# Patient Record
Sex: Female | Born: 1980
Health system: Southern US, Community
[De-identification: ages and names within clinical notes are randomized; demographics above are authoritative.]

## PROBLEM LIST (undated history)

## (undated) DIAGNOSIS — I10 Essential (primary) hypertension: Secondary | ICD-10-CM

## (undated) DIAGNOSIS — M1990 Unspecified osteoarthritis, unspecified site: Secondary | ICD-10-CM

## (undated) DIAGNOSIS — M7660 Achilles tendinitis, unspecified leg: Secondary | ICD-10-CM

## (undated) DIAGNOSIS — E785 Hyperlipidemia, unspecified: Secondary | ICD-10-CM

## (undated) HISTORY — DX: Hyperlipidemia, unspecified: E78.5

## (undated) HISTORY — DX: Achilles tendinitis, unspecified leg: M76.60

## (undated) HISTORY — PX: CHOLECYSTECTOMY: SHX55

## (undated) HISTORY — DX: Unspecified osteoarthritis, unspecified site: M19.90

---

## 2010-07-22 ENCOUNTER — Emergency Department: Payer: Self-pay | Admitting: Emergency Medicine

## 2010-08-10 ENCOUNTER — Ambulatory Visit: Payer: Self-pay | Admitting: Surgery

## 2011-09-26 ENCOUNTER — Inpatient Hospital Stay: Payer: Self-pay | Admitting: Student

## 2011-09-26 LAB — CBC WITH DIFFERENTIAL/PLATELET
Eosinophil #: 0 10*3/uL (ref 0.0–0.7)
Eosinophil %: 0 %
MCH: 27.2 pg (ref 26.0–34.0)
MCHC: 33.6 g/dL (ref 32.0–36.0)
MCV: 81 fL (ref 80–100)
Monocyte #: 1.2 10*3/uL — ABNORMAL HIGH (ref 0.0–0.7)
Neutrophil %: 88.9 %
Platelet: 325 10*3/uL (ref 150–440)
RBC: 4.66 10*6/uL (ref 3.80–5.20)
RDW: 16.1 % — ABNORMAL HIGH (ref 11.5–14.5)

## 2011-09-26 LAB — URINALYSIS, COMPLETE
Bacteria: NONE SEEN
Glucose,UR: NEGATIVE mg/dL (ref 0–75)
Hyaline Cast: 3
Leukocyte Esterase: NEGATIVE
Nitrite: NEGATIVE
Protein: 30
RBC,UR: 1 /HPF (ref 0–5)
Specific Gravity: 1.02 (ref 1.003–1.030)
Squamous Epithelial: 3

## 2011-09-26 LAB — COMPREHENSIVE METABOLIC PANEL
Albumin: 3.4 g/dL (ref 3.4–5.0)
Anion Gap: 12 (ref 7–16)
Bilirubin,Total: 0.8 mg/dL (ref 0.2–1.0)
Calcium, Total: 8.9 mg/dL (ref 8.5–10.1)
Chloride: 96 mmol/L — ABNORMAL LOW (ref 98–107)
Co2: 26 mmol/L (ref 21–32)
Creatinine: 1.43 mg/dL — ABNORMAL HIGH (ref 0.60–1.30)
EGFR (African American): 55 — ABNORMAL LOW
Glucose: 237 mg/dL — ABNORMAL HIGH (ref 65–99)
Osmolality: 276 (ref 275–301)
Potassium: 2.7 mmol/L — ABNORMAL LOW (ref 3.5–5.1)
SGOT(AST): 18 U/L (ref 15–37)
Sodium: 134 mmol/L — ABNORMAL LOW (ref 136–145)

## 2011-09-26 LAB — RAPID INFLUENZA A&B ANTIGENS

## 2011-09-27 LAB — BASIC METABOLIC PANEL
Anion Gap: 11 (ref 7–16)
BUN: 12 mg/dL (ref 7–18)
Chloride: 102 mmol/L (ref 98–107)
Co2: 28 mmol/L (ref 21–32)
Creatinine: 1.13 mg/dL (ref 0.60–1.30)
EGFR (African American): >60
EGFR (Non-African Amer.): 60 — ABNORMAL LOW
Glucose: 112 mg/dL — ABNORMAL HIGH (ref 65–99)

## 2011-09-27 LAB — CBC WITH DIFFERENTIAL/PLATELET
Basophil %: 0 %
Eosinophil #: 0 10*3/uL (ref 0.0–0.7)
HCT: 35.1 % (ref 35.0–47.0)
Lymphocyte #: 2.2 10*3/uL (ref 1.0–3.6)
Lymphocyte %: 8 %
MCH: 26.6 pg (ref 26.0–34.0)
MCHC: 32.5 g/dL (ref 32.0–36.0)
Monocyte #: 2.1 10*3/uL — ABNORMAL HIGH (ref 0.0–0.7)
Monocyte %: 7.8 %
Monocytes: 6 %
Neutrophil #: 23 10*3/uL — ABNORMAL HIGH (ref 1.4–6.5)
Neutrophil %: 84.1 %
Platelet: 280 10*3/uL (ref 150–440)
RBC: 4.3 10*6/uL (ref 3.80–5.20)
RDW: 15.7 % — ABNORMAL HIGH (ref 11.5–14.5)
Segmented Neutrophils: 82 %
WBC: 27.4 10*3/uL — ABNORMAL HIGH (ref 3.6–11.0)

## 2011-09-27 LAB — MAGNESIUM: Magnesium: 1.5 mg/dL — ABNORMAL LOW

## 2011-09-27 LAB — HEMOGLOBIN A1C: Hemoglobin A1C: 6.9 % — ABNORMAL HIGH (ref 4.2–6.3)

## 2011-09-28 LAB — CBC WITH DIFFERENTIAL/PLATELET
Eosinophil #: 0.1 10*3/uL (ref 0.0–0.7)
Eosinophil %: 0.7 %
MCH: 26.1 pg (ref 26.0–34.0)
MCHC: 32 g/dL (ref 32.0–36.0)
Monocyte #: 1.4 10*3/uL — ABNORMAL HIGH (ref 0.0–0.7)
Neutrophil %: 73 %
Platelet: 298 10*3/uL (ref 150–440)
RBC: 4.06 10*6/uL (ref 3.80–5.20)
RDW: 15.9 % — ABNORMAL HIGH (ref 11.5–14.5)

## 2011-10-01 LAB — EXPECTORATED SPUTUM ASSESSMENT W GRAM STAIN, RFLX TO RESP C

## 2011-10-02 LAB — CULTURE, BLOOD (SINGLE)

## 2011-10-19 ENCOUNTER — Ambulatory Visit: Payer: Self-pay | Admitting: Family Medicine

## 2014-12-19 NOTE — Discharge Summary (Signed)
PATIENT NAME:  Leslie Mendoza, [name] MR#:  [id number] DATE OF BIRTH:  1981-01-25  DATE OF ADMISSION:  09/26/2011 DATE OF DISCHARGE:  09/28/2011  DISCHARGE DIAGNOSES:  1. Sepsis secondary to pneumonia.  2. Diarrhea, resolved.  3. Hypertension.  4. Hypokalemia/hypomagnesemia, resolved. 5. Tobacco abuse.   DISCHARGE MEDICATIONS:  1. Acetaminophen 325 mg every 6 hours as needed for fever. 2. Atenolol 25 mg p.o. daily.   New Medications: 1. Levaquin 750 mg daily for seven days. 2. Combivent metered-dose inhaler 2 puffs every six hours p.r.n. for wheezing. 3. Advair 250/50 one 1 puff twice a day.   DIET: Low sodium diet.  DISCHARGE FOLLOWUP: Followup with primary doctor on 10/11/2011 at 9:30 a.m.  HOSPITAL COURSE: The patient is a 34 year old female patient with history of hypertension who came in because of fever up to 103, also had trouble breathing, pleuritic chest pain, and cough. The patient also felt very weak with diarrhea and decreased p.o. intake. Please look at the History and Physical for full details. When she came in, her white count was 24,000 with BUN of 103. She was admitted for sepsis secondary to diarrhea. The patient was  started on IV fluids. Also blood cultures and stool cultures were sent. Rapid flu was negative. Stool for Clostridium difficile was negative. Stool cultures were negative. The patient's chest x-ray showed pneumonia. She was started on IV antibiotics with Levaquin along with IV fluids. The patient's temperatures have resolved. She actually was also started on Advair and DuoNebs. The patient has gotten considerable improvement since admission with no fever since admission and also blood pressure has been stable; it is 119/74 this morning. Her other tests showed white count has improved to 15.1 today; it was 27.4 yesterday. The patient's blood cultures have been negative. Influenza titers are negative. Her chest x-ray showed right lower lobe pneumonia, on 09/26/2011.  The patient's potassium was low, which was replaced. Potassium was 2.78 on admission and creatinine was 1.43 with BUN of 13 on admission. Repeat potassium was 3 yesterday. Creatinine is better, at 1.13 and BUN 12, that was on 09/27/2011. The patient's condition is stable at this time and her sepsis is resolved. Her hyperkalemia has been treated. She is taking p.o. intake. Everything is much better. Her cough has resolved. The patient is stable for discharge. She was counseled about smoking cessation and offered a nicotine patch. She can continue Advair and Combivent and continue medicines for blood pressure. She is to see her primary doctor in a week or so. Her condition is stable.   TIME SPENT ON DISCHARGE PREPARATION: More than 30 minutes.  ____________________________ Katha HammingSnehalatha Jamal Haskin, MD sk:slb D: 09/28/2011 11:52:28 ET T: 09/30/2011 12:49:53 ET JOB#: [id number]  cc: Katha HammingSnehalatha Tylesha Gibeault, MD, <Dictator> Katha HammingSNEHALATHA Haylin Camilli MD ELECTRONICALLY SIGNED 10/15/2011 16:19

## 2014-12-19 NOTE — H&P (Signed)
PATIENT NAME:  Leslie Mendoza, Leslie Mendoza MR#:  [id number] DATE OF BIRTH:  [birth year]  DATE OF ADMISSION:  09/26/2011  REFERRING PHYSICIAN: Dr. Carollee MassedKaminski   CHIEF COMPLAINT: Fevers, chills, shortness of breath.   HISTORY OF PRESENT ILLNESS: The patient is a 34 year old African American female with history of hypertension who is obese who presents with above symptoms. The patient states that she has been experiencing a productive cough which is whitish to greenish sputum for about two weeks with shortness of breath and pleuritic chest pains when she coughs or has deep respiration. She denies having any sick contacts. She has had off and on fevers, shakes. She has been experiencing weakness. She has had poor p.o. intake. Since Saturday she has had multiple episodes of diarrhea as well. Initially it was about 5 times a day and now it is 2 or 3 times a day. It is watery, brownish. She denies having any abdominal pain. She denies having any urinary tract infection symptoms. On arrival, she was found with fever of 103 with WBC of 24,000 and was given Levaquin and blood cultures were sent. Furthermore, she has hypokalemia of 2.7. Hospitalist services were contacted for evaluation and management   PAST MEDICAL HISTORY: Hypertension.   PAST SURGICAL HISTORY: Status post cholecystectomy.   MEDICATIONS: Takes atenolol, unknown dose.   SOCIAL HISTORY: Smokes tobacco, about seven cigarettes per day. Denies other drugs or alcohol. 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   ALLERGIES: Pineapple causes hives.   FAMILY HISTORY: Dad with heart disease. Mom with breast cancer.   REVIEW OF SYSTEMS: CONSTITUTIONAL: Positive for fevers, fatigue, and generalized weakness. No weight changes. EYES: Had some blurry vision yesterday and today. ENT: Had left-sided ear infection about a month ago. Denies having any drainage, postnasal drip, or ear pain. RESPIRATORY: Has a productive cough. Occasional wheezing. No hemoptysis. Has some  dyspnea. No history of asthma or COPD. CARDIOVASCULAR: Pleuritic chest pain as above. No edema or arrhythmia. Has some dyspnea on exertion. She also has history of high blood pressure. GI: Nausea without vomiting. Diarrhea as above. No abdominal pain, rectal bleeding, melena. GU: Denies dysuria, hematuria, or frequency. ENDOCRINE: Denies polyuria, nocturia, or thyroid problems. HEME/LYMPH: Denies anemia, easy bruising, or swollen glands. SKIN: Denies having any new rashes. MUSCULOSKELETAL: Denies arthritis and gout. NEUROLOGIC: Had some numbness in fingertips yesterday. No ataxia, dementia, or history of CVA. PSYCH: No anxiety or insomnia.   PHYSICAL EXAMINATION:   VITAL SIGNS: Temperature on arrival 103.3, pulse 146, respiratory rate 20, blood pressure 134/70, oxygen saturation 95% on room air.   GENERAL: The patient is a morbidly obese African American female in mild respiratory distress talking in full sentences.   HEENT: Normocephalic, atraumatic. Pupils are equal and reactive. Dry mucous membranes. Anicteric sclerae.   NECK: Supple. No JVD. No thyroid tenderness.   CARDIOVASCULAR: S1, S2 slightly tachycardic. No murmurs, rubs, or gallops.   LUNGS: Rales on the left side with some scattered wheezing in the left as well. Good air entry otherwise.   ABDOMEN: Soft. Hyperactive bowel sounds. No organomegaly. Nontender to deep palpation.   EXTREMITIES: No significant edema. Cranial nerves II through XII grossly intact. Strength 5 out of 5 in all extremities.   PSYCH: Awake, alert, oriented x3. Mood and affect normal.   LABORATORY, DIAGNOSTIC, AND RADIOLOGICAL DATA: Glucose on arrival 237, creatinine 1.43, sodium 134, potassium 2.7, chloride 96, total protein 9. LFTs are otherwise within normal limits. WBC elevated at 24.5, hemoglobin 12.7, hematocrit 37.8, platelets  325. Urinalysis no nitrates or leukocyte esterase, no bacteria, 2 WBCs. Pregnancy test is negative. X-ray of the chest not  officially read but possible left-sided pneumonia. EKG showing sinus tach with rate of 140, nonspecific ST and T wave abnormalities.  ASSESSMENT AND PLAN: We have a 34 year old a morbidly obese African American female with history of hypertension with two week history of productive cough, shortness of breath, wheezing, fevers with diarrhea who presents with sepsis, hypokalemia, and possible renal failure.  1. For sepsis, the patient is tachycardic with a fever, leukocytosis, and possible sites of infection, could be the lungs versus the stool. At this point we will admit the patient to the hospital. She denies having any sick contacts or recent antibiotic use. Given the significant leukocytosis and the high-grade fever, we would panculture the patient including blood, urine, sputum, rapid flu, Clostridium difficile, and stool cultures. Would also check urine strep and Legionella antigens and continue the Levaquin which was given in the ER. We will start the patient on nebulizer treatments as well for shortness of breath.  2. Hypokalemia likely in the setting of GI losses and we will replete this. The patient also has low GFR suggesting acute renal failure. Her BUN and creatinine in 2011 was normal. I don't know if this is acute or more chronic in nature. Possibly it is acute in the setting of sepsis, GI losses, and dehydration. We would replete this and monitor in the morning.   3. Tobacco abuse. She was counseled for at least three minutes.  4. In regards to her hypertension, would continue the atenolol.   CODE STATUS: FULL CODE.   TOTAL TIME SPENT: 60 minutes.   ____________________________ Krystal Eaton, MD sa:drc D: 09/26/2011 18:45:06 ET T: 09/27/2011 06:14:09 ET JOB#: [id number]  cc: Krystal Eaton, MD, <Dictator> Krystal Eaton MD ELECTRONICALLY SIGNED 10/19/2011 15:23

## 2016-03-21 ENCOUNTER — Emergency Department
Admission: EM | Admit: 2016-03-21 | Discharge: 2016-03-21 | Disposition: A | Discharge status: Home / Self Care | Payer: Self-pay | Attending: Emergency Medicine | Admitting: Emergency Medicine

## 2016-03-21 ENCOUNTER — Encounter: Payer: Self-pay | Admitting: Emergency Medicine

## 2016-03-21 DIAGNOSIS — F17210 Nicotine dependence, cigarettes, uncomplicated: Secondary | ICD-10-CM | POA: Insufficient documentation

## 2016-03-21 DIAGNOSIS — R42 Dizziness and giddiness: Secondary | ICD-10-CM

## 2016-03-21 DIAGNOSIS — N912 Amenorrhea, unspecified: Secondary | ICD-10-CM | POA: Insufficient documentation

## 2016-03-21 DIAGNOSIS — I10 Essential (primary) hypertension: Secondary | ICD-10-CM | POA: Insufficient documentation

## 2016-03-21 DIAGNOSIS — N6011 Diffuse cystic mastopathy of right breast: Secondary | ICD-10-CM | POA: Insufficient documentation

## 2016-03-21 LAB — GLUCOSE, CAPILLARY: GLUCOSE-CAPILLARY: 89 mg/dL (ref 65–99)

## 2016-03-21 LAB — BASIC METABOLIC PANEL
ANION GAP: 7 (ref 5–15)
BUN: 10 mg/dL (ref 6–20)
CO2: 26 mmol/L (ref 22–32)
Calcium: 9.2 mg/dL (ref 8.9–10.3)
Chloride: 107 mmol/L (ref 101–111)
Creatinine, Ser: 0.89 mg/dL (ref 0.44–1.00)
GFR calc Af Amer: >60 mL/min (ref >60)
GFR calc non Af Amer: >60 mL/min (ref >60)
GLUCOSE: 92 mg/dL (ref 65–99)
POTASSIUM: 3.8 mmol/L (ref 3.5–5.1)
Sodium: 140 mmol/L (ref 135–145)

## 2016-03-21 LAB — URINALYSIS COMPLETE WITH MICROSCOPIC (ARMC ONLY)
BACTERIA UA: NONE SEEN
Bilirubin Urine: NEGATIVE
GLUCOSE, UA: NEGATIVE mg/dL
Hgb urine dipstick: NEGATIVE
Ketones, ur: NEGATIVE mg/dL
Leukocytes, UA: NEGATIVE
Nitrite: NEGATIVE
PROTEIN: NEGATIVE mg/dL
Specific Gravity, Urine: 1.017 (ref 1.005–1.030)
pH: 5 (ref 5.0–8.0)

## 2016-03-21 LAB — CBC
HEMATOCRIT: 44.5 % (ref 35.0–47.0)
HEMOGLOBIN: 14.6 g/dL (ref 12.0–16.0)
MCH: 25.9 pg — AB (ref 26.0–34.0)
MCHC: 32.8 g/dL (ref 32.0–36.0)
MCV: 79 fL — ABNORMAL LOW (ref 80.0–100.0)
Platelets: 321 10*3/uL (ref 150–440)
RBC: 5.64 MIL/uL — ABNORMAL HIGH (ref 3.80–5.20)
RDW: 16.5 % — AB (ref 11.5–14.5)
WBC: 7.5 10*3/uL (ref 3.6–11.0)

## 2016-03-21 MED ORDER — HYDROCHLOROTHIAZIDE 25 MG PO TABS: 25.0000 mg | ORAL_TABLET | Freq: Every day | ORAL | 0 refills | Status: DC

## 2016-03-21 NOTE — Discharge Instructions (Signed)
As we discussed, your workup was reassuring today except that we think that it is time for you to start back on a blood pressure medication.  We started back on an appropriate medication today but it is very important that you follow-up with a primary care doctor to manage your health moving forward.  Please call the number provided to establish a primary care doctor near you.  You should also discuss with the primary care doctor your amenorrhea (not having a menstrual cycle) as well as the fibrocystic breast changes we discussed today.  Return to the emergency department if you develop new or worsening symptoms that concern you.

## 2016-03-21 NOTE — ED Triage Notes (Signed)
Pt presents with reports of dizziness, headache, joint pain and nausea for over one week. Pt also reports pain in her nipples, pt states she has two knots in her breasts but they keep disappearing.

## 2016-03-21 NOTE — ED Provider Notes (Signed)
[facility] Emergency Department Provider Note  ____________________________________________   First MD Initiated Contact with Patient 03/21/16 1430     (approximate)  I have reviewed the triage vital signs and the nursing notes.   HISTORY  Chief Complaint Dizziness    HPI Leslie Mendoza is a 35 y.o. female with no primary care provider who reports a prior history of hypertension but he is no longer on medication.  She reports gradual onset of mild lightheadedness and dizziness with occasional headache over the last 1-2 weeks.  It is more noticeable with ambulation but there is not anything in particular makes it better or worse.  Shedenies taking any daily medications and is not aware of any other chronic medical problems.  She states that she does not have regular periods and has not for years, stating that she only occasionally has a menstrual cycle.  She does not know why this is.  She denies fever/chills, chest pain, shortness of breath, nausea, vomiting, visual changes, cough, congestion, dysuria, abdominal pain.  Overall she describes the symptoms as mild to moderate.  She also complains of a firm nodule in the left side of her right breast that sometimes seems to change in size and shape.  She reports that it is occasionally painful.  She has not had any lactation or discharge from her nipples.  History reviewed. No pertinent past medical history.  There are no active problems to display for this patient.   Past Surgical History:  Procedure Laterality Date  . CHOLECYSTECTOMY      Prior to Admission medications   Medication Sig Start Date End Date Taking? Authorizing Provider  hydrochlorothiazide (HYDRODIURIL) 25 MG tablet Take 1 tablet (25 mg total) by mouth daily. 03/21/16   Loleta Rose, MD    Allergies Review of patient's allergies indicates no known allergies.  No family history on file.  Social History Social History  Substance Use  Topics  . Smoking status: Current Every Day Smoker    Packs/day: 0.50    Types: Cigarettes  . Smokeless tobacco: Never Used  . Alcohol use No    Review of Systems Constitutional: No fever/chills Eyes: No visual changes. ENT: No sore throat. Cardiovascular: Denies chest pain. Respiratory: Denies shortness of breath. Gastrointestinal: No abdominal pain.  No nausea, no vomiting.  No diarrhea.  No constipation. Genitourinary: Negative for dysuria. Musculoskeletal: Negative for back pain. Skin: Negative for rash.  Occasionally painful, changing in size firm lump in her right breast Neurological: Some generalized lightheadedness/dizziness and headache, no focal weakness nor numbness  10-point ROS otherwise negative.  ____________________________________________   PHYSICAL EXAM:  VITAL SIGNS: ED Triage Vitals  Enc Vitals Group     BP 03/21/16 1210 (!) 165/111     Pulse Rate 03/21/16 1210 96     Resp 03/21/16 1210 18     Temp 03/21/16 1210 98.7 F (37.1 C)     Temp src --      SpO2 03/21/16 1210 97 %     Weight 03/21/16 1210 (!) 350 lb (158.8 kg)     Height 03/21/16 1210 5\' 10"  (1.778 m)     Head Circumference --      Peak Flow --      Pain Score 03/21/16 1211 4     Pain Loc --      Pain Edu? --      Excl. in GC? --     Constitutional: Alert and oriented. Well appearing and in no  acute distress. Eyes: Conjunctivae are normal. PERRL. EOMI. Head: Atraumatic. Nose: No congestion/rhinnorhea. Mouth/Throat: Mucous membranes are moist.  Oropharynx non-erythematous. Neck: No stridor.  No meningeal signs.   Cardiovascular: Normal rate, regular rhythm. Good peripheral circulation. Grossly normal heart sounds.   Respiratory: Normal respiratory effort.  No retractions. Lungs CTAB. Gastrointestinal: Obese.  Soft and nontender. No distention.  Musculoskeletal: No lower extremity tenderness nor edema. No gross deformities of extremities. Neurologic:  Normal speech and language. No  gross focal neurologic deficits are appreciated.  Ambulatory without any difficulty. Skin:  Skin is warm, dry and intact. No rash noted.  Nurse was present as chaperone for breast exam.  She has no sign of abscess or cellulitis.  There is a small area of firmness and the medial aspect of the right breast most consistent with fibrocystic breast changes. Psychiatric: Mood and affect are normal. Speech and behavior are normal.  ____________________________________________   LABS (all labs ordered are listed, but only abnormal results are displayed)  Labs Reviewed  CBC - Abnormal; Notable for the following:       Result Value   RBC 5.64 (*)    MCV 79.0 (*)    MCH 25.9 (*)    RDW 16.5 (*)    All other components within normal limits  URINALYSIS COMPLETEWITH MICROSCOPIC ([facility] ONLY) - Abnormal; Notable for the following:    Color, Urine YELLOW (*)    APPearance HAZY (*)    Squamous Epithelial / LPF 6-30 (*)    All other components within normal limits  URINE CULTURE  BASIC METABOLIC PANEL  GLUCOSE, CAPILLARY  CBG MONITORING, ED   ____________________________________________  EKG  ED ECG REPORT I, Pretty Weltman, the attending physician, personally viewed and interpreted this ECG.  Date: 03/21/2016 EKG Time: 12:18 Rate: 79 Rhythm: Normal sinus rhythm with sinus arrhythmia QRS Axis: normal Intervals: normal ST/T Wave abnormalities: Non-specific ST segment / T-wave changes, but no evidence of acute ischemia. Conduction Disturbances: none Narrative Interpretation: unremarkable  ____________________________________________  RADIOLOGY   No results found.  ____________________________________________   PROCEDURES  Procedure(s) performed:   Procedures   ____________________________________________   INITIAL IMPRESSION / ASSESSMENT AND PLAN / ED COURSE  Pertinent labs & imaging results that were available during my care of the patient were reviewed by me and  considered in my medical decision making (see chart for details).  Her workup was unremarkable today except for hypertension with a diastolic of 111.  I suspect that her chronic hypertension has been increasing over the last couple of months and that she would benefit from starting on a low-dose antihypertensive medication until she can get established with a primary care provider.  She has no neurological findings and currently has no headache.  I do not believe she would benefit from imaging of her head.  We discussed starting on medicine and the importance of outpatient follow-up.  I also encouraged her to follow up with her new primary care doctor or a GYN specialist regarding her amenorrhea and fibrocystic breast changes but I attempted to provide reassurance that the breast lump appears benign but is worthy of follow-up as an outpatient.  She understands and agrees with the plan.  Clinical Course    ____________________________________________  FINAL CLINICAL IMPRESSION(S) / ED DIAGNOSES  Final diagnoses:  Essential hypertension  Lightheadedness  Fibrocystic breast changes, right  Amenorrhea     MEDICATIONS GIVEN DURING THIS VISIT:  Medications - No data to display   NEW OUTPATIENT MEDICATIONS STARTED DURING THIS  VISIT:  New Prescriptions   HYDROCHLOROTHIAZIDE (HYDRODIURIL) 25 MG TABLET    Take 1 tablet (25 mg total) by mouth daily.      Note:  This document was prepared using Dragon voice recognition software and may include unintentional dictation errors.    Loleta Rose, MD 03/21/16 [phone]

## 2016-03-21 NOTE — ED Notes (Signed)
Patient states " I have been having on and off nipple pain. I also keep feeling a knot on the middle of each breast that comes and goes"

## 2016-03-22 LAB — URINE CULTURE: Special Requests: NORMAL

## 2016-09-14 ENCOUNTER — Emergency Department
Admission: EM | Admit: 2016-09-14 | Discharge: 2016-09-14 | Disposition: A | Discharge status: Home / Self Care | Payer: Self-pay | Attending: Emergency Medicine | Admitting: Emergency Medicine

## 2016-09-14 ENCOUNTER — Encounter: Payer: Self-pay | Admitting: Emergency Medicine

## 2016-09-14 DIAGNOSIS — R112 Nausea with vomiting, unspecified: Secondary | ICD-10-CM | POA: Insufficient documentation

## 2016-09-14 DIAGNOSIS — I10 Essential (primary) hypertension: Secondary | ICD-10-CM | POA: Insufficient documentation

## 2016-09-14 DIAGNOSIS — F17210 Nicotine dependence, cigarettes, uncomplicated: Secondary | ICD-10-CM | POA: Insufficient documentation

## 2016-09-14 DIAGNOSIS — R109 Unspecified abdominal pain: Secondary | ICD-10-CM | POA: Insufficient documentation

## 2016-09-14 DIAGNOSIS — R197 Diarrhea, unspecified: Secondary | ICD-10-CM | POA: Insufficient documentation

## 2016-09-14 HISTORY — DX: Essential (primary) hypertension: I10

## 2016-09-14 LAB — URINALYSIS, COMPLETE (UACMP) WITH MICROSCOPIC
Bacteria, UA: NONE SEEN
Bilirubin Urine: NEGATIVE
Glucose, UA: NEGATIVE mg/dL
HGB URINE DIPSTICK: NEGATIVE
Ketones, ur: 5 mg/dL — AB
Leukocytes, UA: NEGATIVE
Nitrite: NEGATIVE
PROTEIN: 100 mg/dL — AB
RBC / HPF: NONE SEEN RBC/hpf (ref 0–5)
Specific Gravity, Urine: 1.027 (ref 1.005–1.030)
pH: 5 (ref 5.0–8.0)

## 2016-09-14 LAB — COMPREHENSIVE METABOLIC PANEL
ALBUMIN: 4.4 g/dL (ref 3.5–5.0)
ALT: 36 U/L (ref 14–54)
AST: 32 U/L (ref 15–41)
Alkaline Phosphatase: 129 U/L — ABNORMAL HIGH (ref 38–126)
Anion gap: 8 (ref 5–15)
BUN: 18 mg/dL (ref 6–20)
CHLORIDE: 108 mmol/L (ref 101–111)
CO2: 21 mmol/L — AB (ref 22–32)
CREATININE: 1.17 mg/dL — AB (ref 0.44–1.00)
Calcium: 9.2 mg/dL (ref 8.9–10.3)
GFR calc non Af Amer: 59 mL/min — ABNORMAL LOW (ref >60)
GLUCOSE: 93 mg/dL (ref 65–99)
Potassium: 4.2 mmol/L (ref 3.5–5.1)
SODIUM: 137 mmol/L (ref 135–145)
Total Bilirubin: 0.9 mg/dL (ref 0.3–1.2)
Total Protein: 9.6 g/dL — ABNORMAL HIGH (ref 6.5–8.1)

## 2016-09-14 LAB — LIPASE, BLOOD: LIPASE: 23 U/L (ref 11–51)

## 2016-09-14 LAB — CBC
HCT: 45.3 % (ref 35.0–47.0)
Hemoglobin: 14.9 g/dL (ref 12.0–16.0)
MCH: 25.5 pg — AB (ref 26.0–34.0)
MCHC: 32.8 g/dL (ref 32.0–36.0)
MCV: 77.9 fL — AB (ref 80.0–100.0)
PLATELETS: 333 10*3/uL (ref 150–440)
RBC: 5.82 MIL/uL — AB (ref 3.80–5.20)
RDW: 16.9 % — ABNORMAL HIGH (ref 11.5–14.5)
WBC: 9.4 10*3/uL (ref 3.6–11.0)

## 2016-09-14 LAB — POCT PREGNANCY, URINE: PREG TEST UR: NEGATIVE

## 2016-09-14 MED ORDER — ONDANSETRON HCL 4 MG PO TABS: 4.0000 mg | ORAL_TABLET | Freq: Three times a day (TID) | ORAL | 0 refills | Status: DC | PRN

## 2016-09-14 NOTE — Discharge Instructions (Signed)
Your exam and evaluation are reassuring in the emergency department today. Return to the emergency room for any new or worsening abdominal pain, fever, black or bloody stool, persistent vomiting and diarrhea and concern for dehydration such as dry mouth or not making urine, dizziness or passing out, or any other symptoms concerning to you.  You may try over-the-counter Imodium for diarrhea.

## 2016-09-14 NOTE — ED Notes (Signed)
Discharge instructions reviewed with patient. Questions fielded by this RN. Patient verbalizes understanding of instructions. [facility] No acute distress noted at time of discharge.   Pt drinking water, appears in good spirits.  Accompanied by sig other.

## 2016-09-14 NOTE — ED Triage Notes (Signed)
C/O vomiting on Monday, nausea, diarrhea once a day since Monday.  Denies fever.  Denies dysuria.  Also c/o lower abdominal pain after eating x 2 days.l

## 2016-09-14 NOTE — ED Provider Notes (Signed)
[facility] Emergency Department Provider Note ____________________________________________   I have reviewed the triage vital signs and the triage nursing note.  HISTORY  Chief Complaint Abdominal Pain and Nausea   Historian Patient and friend at the bedside  HPI Leslie R Dan HumphreysMebane is a 36 y.o. female presents with vomiting and diarrhea since Sunday night. Started off with multiple days of watery vomiting, followed by diarrhea. She states that the vomiting and diarrhea has eased off some, and less she tries to eat food in which case she starts to have some diarrhea which is watery and nonbloody. No fever. No known exposures or bad food. No significant travel history. No recent antibiotics.  No respiratory symptoms. She states that she gets abdominal cramping and nausea after she eats although she is hungry. Denies pelvic pain. Denies vaginal discharge or vaginal bleeding.      Past Medical History:  Diagnosis Date  . Hypertension     There are no active problems to display for this patient.   Past Surgical History:  Procedure Laterality Date  . CHOLECYSTECTOMY      Prior to Admission medications   Medication Sig Start Date End Date Taking? Authorizing Provider  hydrochlorothiazide (HYDRODIURIL) 25 MG tablet Take 1 tablet (25 mg total) by mouth daily. 03/21/16   Loleta Roseory Forbach, MD  ondansetron (ZOFRAN) 4 MG tablet Take 1 tablet (4 mg total) by mouth every 8 (eight) hours as needed for nausea or vomiting. 09/14/16   Governor Rooksebecca Keyoni Lapinski, MD    No Known Allergies  No family history on file.  Social History Social History  Substance Use Topics  . Smoking status: Current Every Day Smoker    Packs/day: 0.50    Types: Cigarettes  . Smokeless tobacco: Never Used  . Alcohol use No    Review of Systems  Constitutional: Negative for fever. Eyes: Negative for visual changes. ENT: Negative for sore throat. Cardiovascular: Negative for chest  pain. Respiratory: Negative for shortness of breath. Gastrointestinal: As per history of present illness. Genitourinary: Negative for dysuria. Musculoskeletal: Negative for back pain. Skin: Negative for rash. Neurological: Negative for headache. 10 point Review of Systems otherwise negative ____________________________________________   PHYSICAL EXAM:  VITAL SIGNS: ED Triage Vitals  Enc Vitals Group     BP 09/14/16 1531 (!) 148/97     Pulse Rate 09/14/16 1531 85     Resp 09/14/16 1531 18     Temp 09/14/16 1531 98.4 F (36.9 C)     Temp Source 09/14/16 1531 Oral     SpO2 09/14/16 1531 98 %     Weight 09/14/16 1529 (!) 340 lb (154.2 kg)     Height 09/14/16 1529 5\' 10"  (1.778 m)     Head Circumference --      Peak Flow --      Pain Score 09/14/16 1531 0     Pain Loc --      Pain Edu? --      Excl. in GC? --      Constitutional: Alert and oriented. Well appearing and in no distress. HEENT   Head: Normocephalic and atraumatic.      Eyes: Conjunctivae are normal. PERRL. Normal extraocular movements.      Ears:         Nose: No congestion/rhinnorhea.   Mouth/Throat: Mucous membranes are moist.   Neck: No stridor. Cardiovascular/Chest: Normal rate, regular rhythm.  No murmurs, rubs, or gallops. Respiratory: Normal respiratory effort without tachypnea nor retractions. Breath sounds are clear  and equal bilaterally. No wheezes/rales/rhonchi. Gastrointestinal: Soft. No distention, no guarding, no rebound. Nontender In 4 quadrants. Genitourinary/rectal:  Deferred Musculoskeletal: Nontender with normal range of motion in all extremities. No joint effusions.  No lower extremity tenderness.  No edema. Neurologic:  Normal speech and language. No gross or focal neurologic deficits are appreciated. Skin:  Skin is warm, dry and intact. No rash noted. Psychiatric: Mood and affect are normal. Speech and behavior are normal. Patient exhibits appropriate insight and  judgment.   ____________________________________________  LABS (pertinent positives/negatives)  Labs Reviewed  COMPREHENSIVE METABOLIC PANEL - Abnormal; Notable for the following:       Result Value   CO2 21 (*)    Creatinine, Ser 1.17 (*)    Total Protein 9.6 (*)    Alkaline Phosphatase 129 (*)    GFR calc non Af Amer 59 (*)    All other components within normal limits  CBC - Abnormal; Notable for the following:    RBC 5.82 (*)    MCV 77.9 (*)    MCH 25.5 (*)    RDW 16.9 (*)    All other components within normal limits  URINALYSIS, COMPLETE (UACMP) WITH MICROSCOPIC - Abnormal; Notable for the following:    Color, Urine YELLOW (*)    APPearance CLOUDY (*)    Ketones, ur 5 (*)    Protein, ur 100 (*)    Squamous Epithelial / LPF 6-30 (*)    All other components within normal limits  LIPASE, BLOOD  POCT PREGNANCY, URINE  POC URINE PREG, ED    ____________________________________________    EKG I, Governor Rooks, MD, the attending physician have personally viewed and interpreted all ECGs.  None ____________________________________________  RADIOLOGY All Xrays were viewed by me. Imaging interpreted by Radiologist.  None __________________________________________  PROCEDURES  Procedure(s) performed: None  Critical Care performed: None  ____________________________________________   ED COURSE / ASSESSMENT AND PLAN  Pertinent labs & imaging results that were available during my care of the patient were reviewed by me and considered in my medical decision making (see chart for details).   Ms. Durwin Glaze is here after several days of vomiting and diarrhea although she states that she is a little nauseated now, she is not having any abdominal pain. She also denies pelvic pain or vaginal discharge. I recommended pelvic exam, but she declined and I think this is reasonable given her symptoms.  After studies are reassuring overall. She is not clinically dehydrated and I  did discuss if she wanted to do a lot one pack of IV fluids here to "take her for "we could do that, alternatively she is able to take in fluids and she stated she would rather do that she is ready to leave the emergency department now. I am going to give her dose of Zofran here and discharge her with Zofran and I also discussed that she could try Imodium at home.  Sounds like she is having intestinal irritation after that his GI illness which I suspect is likely virus given was going around in the community right now.  I'm not recommending imaging at this point in time, patient feels somewhat better and she is mostly wondering what she could do to be able to eat without having bad nausea and diarrhea.    CONSULTATIONS:   None Patient / Family / Caregiver informed of clinical course, medical decision-making process, and agree with plan.   I discussed return precautions, follow-up instructions, and discharge instructions with patient and/or family.  ___________________________________________   FINAL CLINICAL IMPRESSION(S) / ED DIAGNOSES   Final diagnoses:  Nausea vomiting and diarrhea              Note: This dictation was prepared with Dragon dictation. Any transcriptional errors that result from this process are unintentional    Governor Rooks, MD 09/14/16 1849

## 2017-02-07 ENCOUNTER — Encounter: Payer: Self-pay | Admitting: Emergency Medicine

## 2017-02-07 ENCOUNTER — Emergency Department
Admission: EM | Admit: 2017-02-07 | Discharge: 2017-02-07 | Disposition: A | Discharge status: Home / Self Care | Payer: Managed Care, Other (non HMO) | Attending: Emergency Medicine | Admitting: Emergency Medicine

## 2017-02-07 DIAGNOSIS — J029 Acute pharyngitis, unspecified: Secondary | ICD-10-CM | POA: Diagnosis present

## 2017-02-07 DIAGNOSIS — J020 Streptococcal pharyngitis: Secondary | ICD-10-CM | POA: Insufficient documentation

## 2017-02-07 DIAGNOSIS — I10 Essential (primary) hypertension: Secondary | ICD-10-CM | POA: Insufficient documentation

## 2017-02-07 DIAGNOSIS — Z79899 Other long term (current) drug therapy: Secondary | ICD-10-CM | POA: Diagnosis not present

## 2017-02-07 DIAGNOSIS — F17210 Nicotine dependence, cigarettes, uncomplicated: Secondary | ICD-10-CM | POA: Insufficient documentation

## 2017-02-07 DIAGNOSIS — J039 Acute tonsillitis, unspecified: Secondary | ICD-10-CM

## 2017-02-07 LAB — POCT RAPID STREP A: Streptococcus, Group A Screen (Direct): NEGATIVE

## 2017-02-07 MED ORDER — ACETAMINOPHEN-CODEINE #3 300-30 MG PO TABS
1.0000 | ORAL_TABLET | Freq: Once | ORAL | Status: AC
  Administered 2017-02-07: 1 via ORAL
  Filled 2017-02-07: qty 1

## 2017-02-07 MED ORDER — PENICILLIN G BENZATHINE 1200000 UNIT/2ML IM SUSP
1.2000 10*6.[IU] | Freq: Once | INTRAMUSCULAR | Status: AC
  Administered 2017-02-07: 1.2 10*6.[IU] via INTRAMUSCULAR
  Filled 2017-02-07: qty 2

## 2017-02-07 MED ORDER — ACETAMINOPHEN-CODEINE #3 300-30 MG PO TABS: 1.0000 | ORAL_TABLET | Freq: Three times a day (TID) | ORAL | 0 refills | Status: DC | PRN

## 2017-02-07 NOTE — ED Provider Notes (Signed)
[facility] Emergency Department Provider Note ____________________________________________  Time seen: 1220  I have reviewed the triage vital signs and the nursing notes.  HISTORY  Chief Complaint  Sore Throat  HPI Leslie Mendoza is a 36 y.o. female presents to the ED for evaluation of the 2-3 day complaint of fevers, body aches, and sore throat. The patient describes a Tmax of 103F. She's been taking Tylenol and Motrin over the last 2 days for symptom relief. She denies any sick contacts, recent travel, or other exposures. She describes pain with swallowing but denies any nausea, vomiting, or rash.  Past Medical History:  Diagnosis Date  . Hypertension     There are no active problems to display for this patient.   Past Surgical History:  Procedure Laterality Date  . CHOLECYSTECTOMY      Prior to Admission medications   Medication Sig Start Date End Date Taking? Authorizing Provider  acetaminophen-codeine (TYLENOL #3) 300-30 MG tablet Take 1 tablet by mouth every 8 (eight) hours as needed for moderate pain. 02/07/17   Fernado Brigante, Charlesetta Ivory, PA-C  hydrochlorothiazide (HYDRODIURIL) 25 MG tablet Take 1 tablet (25 mg total) by mouth daily. 03/21/16   Loleta Rose, MD  ondansetron (ZOFRAN) 4 MG tablet Take 1 tablet (4 mg total) by mouth every 8 (eight) hours as needed for nausea or vomiting. 09/14/16   Governor Rooks, MD    Allergies Pineapple  History reviewed. No pertinent family history.  Social History Social History  Substance Use Topics  . Smoking status: Current Every Day Smoker    Packs/day: 0.50    Types: Cigarettes  . Smokeless tobacco: Never Used  . Alcohol use No    Review of Systems  Constitutional: Positive for fever. Eyes: Negative for visual changes. ENT: Positive for sore throat. Cardiovascular: Negative for chest pain. Respiratory: Negative for shortness of breath. Gastrointestinal: Negative for abdominal pain, vomiting  and diarrhea. Skin: Negative for rash. ____________________________________________  PHYSICAL EXAM:  VITAL SIGNS: ED Triage Vitals  Enc Vitals Group     BP 02/07/17 1122 (!) 145/100     Pulse Rate 02/07/17 1118 (!) 110     Resp 02/07/17 1118 18     Temp 02/07/17 1118 99.9 F (37.7 C)     Temp Source 02/07/17 1118 Oral     SpO2 02/07/17 1118 97 %     Weight 02/07/17 1118 295 lb (133.8 kg)     Height 02/07/17 1118 5\' 10"  (1.778 m)     Head Circumference --      Peak Flow --      Pain Score 02/07/17 1118 10     Pain Loc --      Pain Edu? --      Excl. in GC? --     Constitutional: Alert and oriented. Well appearing and in no distress. Head: Normocephalic and atraumatic. Eyes: Conjunctivae are normal. PERRL. Normal extraocular movements Ears: Canals clear. TMs intact bilaterally. Mouth/Throat: Mucous membranes are moist. Uvula is midline and tonsils are enlarged, erythematous, with exudate noted. No oropharyngeal lesions are appreciated. Neck: Supple. No thyromegaly. Hematological/Lymphatic/Immunological: No cervical lymphadenopathy. Cardiovascular: Normal rate, regular rhythm. Normal distal pulses. Respiratory: Normal respiratory effort. No wheezes/rales/rhonchi. Gastrointestinal: Soft and nontender. No distention. Neurologic:  Normal gait without ataxia. Normal speech and language. No gross focal neurologic deficits are appreciated. Skin:  Skin is warm, dry and intact. No rash noted. ____________________________________________   LABS (pertinent positives/negatives) Labs Reviewed  CULTURE, GROUP A STREP [facility])  POCT RAPID STREP A  ____________________________________________  PROCEDURES  Tylenol #3 PO Penicillin LA 1.2 million units IM ____________________________________________  INITIAL IMPRESSION / ASSESSMENT AND PLAN / ED COURSE  You have been treated with a single injection of penicillin for a suspected strep throat infection. [facility] or return to  the ED for acutely worsening symptoms.  ____________________________________________  FINAL CLINICAL IMPRESSION(S) / ED DIAGNOSES  Final diagnoses:  Strep throat  Tonsillitis      Karmen StabsMenshew, Charlesetta IvoryJenise V Bacon, PA-C 02/07/17 1356    Minna AntisPaduchowski, Kevin, MD 02/07/17 1526

## 2017-02-07 NOTE — Discharge Instructions (Signed)
You have been treated for strep throat. You have a throat culture pending. Return to the ED for worsening signs or symptoms. Take the Tylenol #3 as needed for pain relief.

## 2017-02-07 NOTE — ED Triage Notes (Signed)
Pt c/o sore throat for 2-3 days. Fever at home. Has not taken any meds today.  Swelling and exudate noted to tonsils. Not touching.  Hurts to swallow and talk. Controlling secretion. No respiratory distress.

## 2017-02-07 NOTE — ED Notes (Signed)
Strep culture sent.

## 2017-02-07 NOTE — ED Notes (Signed)
See triage note  States she has had sore throat for the past 3 days  Low grade fever on arrival  Throat and and swollen

## 2017-02-10 LAB — CULTURE, GROUP A STREP (THRC)

## 2018-04-29 ENCOUNTER — Encounter: Payer: Self-pay | Admitting: Podiatry

## 2018-04-29 ENCOUNTER — Ambulatory Visit: Payer: 59 | Admitting: Podiatry

## 2018-04-29 ENCOUNTER — Ambulatory Visit (INDEPENDENT_AMBULATORY_CARE_PROVIDER_SITE_OTHER): Payer: 59

## 2018-04-29 DIAGNOSIS — M7662 Achilles tendinitis, left leg: Secondary | ICD-10-CM

## 2018-04-29 DIAGNOSIS — M722 Plantar fascial fibromatosis: Secondary | ICD-10-CM

## 2018-04-29 MED ORDER — METHYLPREDNISOLONE 4 MG PO TBPK: ORAL_TABLET | ORAL | 0 refills | Status: DC

## 2018-04-29 MED ORDER — MELOXICAM 15 MG PO TABS: 15.0000 mg | ORAL_TABLET | Freq: Every day | ORAL | 1 refills | Status: AC

## 2018-04-29 NOTE — Progress Notes (Signed)
   HPI: 37 year old female presenting today as a new patient with a chief complaint of sharp, stabbing pain to the left foot and heel that began about one year ago. She reports an associated popping sensation and numbness and tingling. She states walking increases the pain but it is greatest when she first wakes in the morning. She has been using ice therapy, taking Tylenol and stretching the area for treatment. Patient is here for further evaluation and treatment.   Past Medical History:  Diagnosis Date  . Hypertension       Physical Exam: General: The patient is alert and oriented x3 in no acute distress.  Dermatology: Skin is warm, dry and supple bilateral lower extremities. Negative for open lesions or macerations.  Vascular: Palpable pedal pulses bilaterally. No edema or erythema noted. Capillary refill within normal limits.  Neurological: Epicritic and protective threshold grossly intact bilaterally.   Musculoskeletal Exam: Pain on palpation noted to the posterior tubercle of the left calcaneus at the insertion of the Achilles tendon consistent with retrocalcaneal bursitis. Range of motion within normal limits. Muscle strength 5/5 in all muscle groups bilateral lower extremities.  Radiographic Exam:  Posterior calcaneal spur noted to the respective calcaneus on lateral view. No fracture or dislocation noted. Normal osseous mineralization noted.     Assessment: 1. Insertional Achilles tendinitis left  2. Retrocalcaneal bursitis   Plan of Care:  1. Patient was evaluated. Radiographs were reviewed today. 2. Injection of 0.5 mL Celestone Soluspan injected into the retrocalcaneal bursa. Care was taken to avoid direct injection into the Achilles tendon. 3. Ankle brace dispensed.  4. Prescription for Medrol Dose Pak provided to patient.  5. Prescription for Meloxicam provided to patient.  6. Stressed importance of daily stretching to alleviate symptoms.  7. Return to clinic in 4  weeks.    Felecia Shelling, DPM [facility]  Dr. Felecia Shelling, DPM    [address]                                        [place], [place] [address]                Office [phone]  Fax [phone]

## 2018-04-29 NOTE — Patient Instructions (Signed)
For instructions on how to put on your Tri-Lock Ankle Brace, please visit [email or web address] 

## 2018-05-27 ENCOUNTER — Ambulatory Visit: Payer: 59 | Admitting: Podiatry

## 2018-06-09 MED FILL — MELOXICAM 15 MG TABLET: 15 | 90 days supply | Qty: 90 | Fill #0

## 2018-06-13 ENCOUNTER — Encounter: Payer: Self-pay | Admitting: Podiatry

## 2018-06-13 ENCOUNTER — Ambulatory Visit: Payer: 59 | Admitting: Podiatry

## 2018-06-13 DIAGNOSIS — M7662 Achilles tendinitis, left leg: Secondary | ICD-10-CM | POA: Diagnosis not present

## 2018-06-13 NOTE — Progress Notes (Signed)
   HPI: 37 year old female presenting today for follow up evaluation of insertional Achilles tendinitis of the left lower extremity. She reports an improvement in her symptoms but notes some continued soreness in the morning and after working all day. She has been using the Achilles pad and taking Meloxicam as directed. Patient is here for further evaluation and treatment.   Past Medical History:  Diagnosis Date  . Hypertension       Physical Exam: General: The patient is alert and oriented x3 in no acute distress.  Dermatology: Skin is warm, dry and supple bilateral lower extremities. Negative for open lesions or macerations.  Vascular: Palpable pedal pulses bilaterally. No edema or erythema noted. Capillary refill within normal limits.  Neurological: Epicritic and protective threshold grossly intact bilaterally.   Musculoskeletal Exam: Pain on palpation noted to the posterior tubercle of the left calcaneus at the insertion of the Achilles tendon consistent with retrocalcaneal bursitis. Range of motion within normal limits. Muscle strength 5/5 in all muscle groups bilateral lower extremities.  Assessment: 1. Insertional Achilles tendinitis left  2. Retrocalcaneal bursitis   Plan of Care:  1. Patient was evaluated.  2. Injection of 0.5 mL Celestone Soluspan injected into the retrocalcaneal bursa. Care was taken to avoid direct injection into the Achilles tendon. 3. Continue taking Meloxicam daily.  4. Continue using ankle brace.  5. Return to clinic in 4 weeks.   Works at [employer].    Felecia Shelling, DPM [facility]  Dr. Felecia Shelling, DPM    [address]                                        [place], [place] [address]                Office [phone]  Fax [phone]

## 2018-07-11 ENCOUNTER — Encounter: Payer: Self-pay | Admitting: Podiatry

## 2018-07-11 ENCOUNTER — Ambulatory Visit: Payer: 59 | Admitting: Podiatry

## 2018-07-11 DIAGNOSIS — M7662 Achilles tendinitis, left leg: Secondary | ICD-10-CM | POA: Diagnosis not present

## 2018-07-14 NOTE — Progress Notes (Signed)
   HPI: 37 year old female presenting today for follow up evaluation of insertional Achilles tendinitis of the left lower extremity. She states the pain has improved since her last visit. She reports some continued pain on the lateral foot. She also reports some tenderness in the right foot likely from overcompensation. She has been taking Meloxicam and using the ankle brace for treatment. Patient is here for further evaluation and treatment.   Past Medical History:  Diagnosis Date  . Hypertension       Physical Exam: General: The patient is alert and oriented x3 in no acute distress.  Dermatology: Skin is warm, dry and supple bilateral lower extremities. Negative for open lesions or macerations.  Vascular: Palpable pedal pulses bilaterally. No edema or erythema noted. Capillary refill within normal limits.  Neurological: Epicritic and protective threshold grossly intact bilaterally.   Musculoskeletal Exam: Pain on palpation noted to the posterior tubercle of the left calcaneus at the insertion of the Achilles tendon consistent with retrocalcaneal bursitis. Range of motion within normal limits. Muscle strength 5/5 in all muscle groups bilateral lower extremities.  Assessment: 1. Insertional Achilles tendinitis left  2. Retrocalcaneal bursitis   Plan of Care:  1. Patient was evaluated.  2. Injection of 0.5 mL Celestone Soluspan injected into the retrocalcaneal bursa. Care was taken to avoid direct injection into the Achilles tendon. 3. Continue taking Meloxicam and using ankle brace.  4. Orders for physical therapy three times weekly for four weeks placed.  5. Return to clinic in 4 weeks.   Works at [employer].    Felecia ShellingBrent M. Evans, DPM [facility]  Dr. Felecia ShellingBrent M. Evans, DPM    [address]                                        [place], [place] [id number]                Office [phone]  Fax [phone]

## 2018-07-18 DIAGNOSIS — M25672 Stiffness of left ankle, not elsewhere classified: Secondary | ICD-10-CM | POA: Diagnosis not present

## 2018-07-18 DIAGNOSIS — M25572 Pain in left ankle and joints of left foot: Secondary | ICD-10-CM | POA: Diagnosis not present

## 2018-08-15 ENCOUNTER — Ambulatory Visit: Payer: 59 | Admitting: Podiatry

## 2018-09-18 ENCOUNTER — Telehealth: Payer: 59 | Admitting: Physician Assistant

## 2018-09-18 DIAGNOSIS — J208 Acute bronchitis due to other specified organisms: Secondary | ICD-10-CM | POA: Diagnosis not present

## 2018-09-18 MED ORDER — BENZONATATE 100 MG PO CAPS: 100.0000 mg | ORAL_CAPSULE | Freq: Three times a day (TID) | ORAL | 0 refills | Status: DC | PRN

## 2018-09-18 NOTE — Progress Notes (Signed)

## 2018-10-06 ENCOUNTER — Encounter: Payer: Self-pay | Admitting: Nurse Practitioner

## 2018-10-06 ENCOUNTER — Ambulatory Visit: Payer: 59 | Admitting: Nurse Practitioner

## 2018-10-06 VITALS — BP 148/110 | HR 71 | Temp 98.8°F | Ht 70.2 in | Wt 352.6 lb

## 2018-10-06 DIAGNOSIS — R7303 Prediabetes: Secondary | ICD-10-CM | POA: Diagnosis not present

## 2018-10-06 DIAGNOSIS — E559 Vitamin D deficiency, unspecified: Secondary | ICD-10-CM | POA: Diagnosis not present

## 2018-10-06 DIAGNOSIS — R002 Palpitations: Secondary | ICD-10-CM

## 2018-10-06 DIAGNOSIS — E782 Mixed hyperlipidemia: Secondary | ICD-10-CM | POA: Diagnosis not present

## 2018-10-06 DIAGNOSIS — I10 Essential (primary) hypertension: Secondary | ICD-10-CM | POA: Diagnosis not present

## 2018-10-06 HISTORY — DX: Palpitations: R00.2

## 2018-10-06 LAB — POCT URINALYSIS DIPSTICK
Bilirubin, UA: NEGATIVE
Blood, UA: NEGATIVE
Glucose, UA: NEGATIVE
Ketones, UA: NEGATIVE
LEUKOCYTES UA: NEGATIVE
Nitrite, UA: NEGATIVE
Protein, UA: NEGATIVE
Spec Grav, UA: 1.02 (ref 1.010–1.025)
Urobilinogen, UA: 0.2 E.U./dL
pH, UA: 6 (ref 5.0–8.0)

## 2018-10-06 LAB — POCT UA - MICROALBUMIN
Albumin/Creatinine Ratio, Urine, POC: 30
Creatinine, POC: 300 mg/dL
MICROALBUMIN (UR) POC: 30 mg/L

## 2018-10-06 MED ORDER — HYDROCHLOROTHIAZIDE 12.5 MG PO TABS: 12.5000 mg | ORAL_TABLET | Freq: Every day | ORAL | 2 refills | Status: DC

## 2018-10-06 MED FILL — HYDROCHLOROTHIAZIDE 12.5 MG: 12.5 | 30 days supply | Qty: 30 | Fill #0

## 2018-10-06 NOTE — Progress Notes (Signed)
Subjective:     Patient ID: Leslie Mendoza , female    DOB: May 15, 1981 , 38 y.o.   MRN: [id number]   Chief Complaint  Patient presents with  . Establish Care  . achilles tendionitis    patient [facility] the [facility] and she was told she has achilles tendonitis and was given meloxicam and it does work but when she doesnt take it for 2 days she has a hard time walking.  Marland Kitchen Headache    patient states she thinks her blood pressure may be running high again she was previously on blood pressure medicine but was taken off by her doctor about 6 years ago    HPI  Here to establish care - she was being seen at [facility] center 6 years ago.  She learned about [place] online.  Lab tech, single, no children.  No PAP in mammogram.  Mother had breast cancer and stomach cancer at the age of 60 deceased from stomach.  No colonoscopy.    PMH - HTN (was taken off by provider), achilles tendinitis (Dr. Logan Bores [facility] - seen in November 2019 started about July 2019), prediabetes (she had changed her diet).    Siblings - brothers healthy.  Father had congestive heart failure deceased at 39 y/o.    She has had heart flutters last in June 2019.  She drinks coffee and tea. She does eat chocolate as well.  She does tend to worry herself a lot.    Headache   This is a new problem. The current episode started 1 to 4 weeks ago. The problem occurs constantly. The problem has been gradually worsening. The pain is located in the left unilateral and occipital region. The pain does not radiate. The quality of the pain is described as dull. The pain is at a severity of 7/10. The pain is moderate. Pertinent negatives include no dizziness, phonophobia, photophobia, rhinorrhea or vomiting. Associated symptoms comments: Weight gain of 30lbs in the last 6 months. She has tried acetaminophen and triptans (rest) for the symptoms. The treatment provided moderate relief. Her past medical history is  significant for hypertension, migraines in the family and obesity. There is no history of migraine headaches or recent head traumas.  Hypertension  This is a chronic problem. The current episode started more than 1 year ago. The problem is uncontrolled. Associated symptoms include headaches. Pertinent negatives include no anxiety or palpitations. (She relates to her worrying.  ) Risk factors for coronary artery disease include obesity and sedentary lifestyle (she has not been able to exercise as much due to her achilles tendinitis). Past treatments include diuretics. There is no history of angina or kidney disease. There is no history of chronic renal disease.     Past Medical History:  Diagnosis Date  . Achilles tendinitis   . Hypertension      Family History  Problem Relation Age of Onset  . Cancer Mother   . Hypertension Mother   . Heart disease Father   . Diabetes Father   . COPD Brother       Current Outpatient Medications:  .  meloxicam (MOBIC) 15 MG tablet, , Disp: , Rfl: 0   Allergies  Allergen Reactions  . Pineapple     hives     Review of Systems  HENT: Negative for rhinorrhea.   Eyes: Negative for photophobia.  Cardiovascular: Negative for palpitations.  Gastrointestinal: Negative for vomiting.  Neurological: Positive for headaches.  Negative for dizziness.    Today's Vitals   10/06/18 1036  BP: (!) 148/110  Pulse: 71  Temp: 98.8 F (37.1 C)  TempSrc: Oral  Weight: (!) 352 lb 9.6 oz (159.9 kg)  Height: 5' 10.2" (1.783 m)  PainSc: 0-No pain   Body mass index is 50.3 kg/m.   Objective:  Physical Exam Vitals signs reviewed.  Constitutional:      Appearance: She is well-developed.  Cardiovascular:     Rate and Rhythm: Normal rate and regular rhythm.  Skin:    General: Skin is warm and dry.  Neurological:     Mental Status: She is alert.         Assessment And Plan:     1. Vitamin D deficiency  Will check vitamin D level and supplement as  needed.     Also encouraged to spend 15 minutes in the sun daily.  - Vitamin D (25 hydroxy)  2. Essential hypertension . B/P is elevated however she has been without her medications for several months. . Encouraged to limit intake of high salt foods.and to increase water intake. . She is to also increase her potassium intake due to the diuretic  . CMP ordered to check renal function.  . The importance of regular exercise and dietary modification was stressed to the patient.  . Stressed importance of losing ten percent of her body weight to help with B/P control.  . The weight loss would help with decreasing cardiac and cancer risk as well.  - CMP14 + Anion Gap - hydrochlorothiazide (HYDRODIURIL) 12.5 MG tablet; Take 1 tablet (12.5 mg total) by mouth daily.  Dispense: 30 tablet; Refill: 2  3. Prediabetes  Chronic, this is my first time seeing her in the office  No current medications  Encouraged to limit intake of sugary foods and drinks  Encouraged to increase physical activity to 150 minutes per week - Hemoglobin A1c  4. Heart palpitations  Will check for metabolic causes.   EKG reveals NSR  Encouraged to avoid caffeine and to make sure she drinks adequate amounts of water - CMP14 + Anion Gap - EKG 12-Lead - TSH  5. Mixed hyperlipidemia  Chronic, controlled  Continue with current medications - Lipid Profile     [name], FNP

## 2018-10-06 NOTE — Patient Instructions (Signed)

## 2018-10-07 ENCOUNTER — Telehealth: Payer: Self-pay | Admitting: Podiatry

## 2018-10-07 LAB — CMP14 + ANION GAP
A/G RATIO: 1.3 (ref 1.2–2.2)
ALT: 11 IU/L (ref 0–32)
AST: 15 IU/L (ref 0–40)
Albumin: 4.3 g/dL (ref 3.8–4.8)
Alkaline Phosphatase: 110 IU/L (ref 39–117)
Anion Gap: 16 mmol/L (ref 10.0–18.0)
BUN/Creatinine Ratio: 15 (ref 9–23)
BUN: 13 mg/dL (ref 6–20)
Bilirubin Total: 0.3 mg/dL (ref 0.0–1.2)
CALCIUM: 9.3 mg/dL (ref 8.7–10.2)
CO2: 24 mmol/L (ref 20–29)
Chloride: 102 mmol/L (ref 96–106)
Creatinine, Ser: 0.85 mg/dL (ref 0.57–1.00)
GFR, EST AFRICAN AMERICAN: 101 mL/min/{1.73_m2} (ref >59)
GFR, EST NON AFRICAN AMERICAN: 87 mL/min/{1.73_m2} (ref >59)
GLOBULIN, TOTAL: 3.2 g/dL (ref 1.5–4.5)
Glucose: 75 mg/dL (ref 65–99)
POTASSIUM: 4.4 mmol/L (ref 3.5–5.2)
SODIUM: 142 mmol/L (ref 134–144)
TOTAL PROTEIN: 7.5 g/dL (ref 6.0–8.5)

## 2018-10-07 LAB — LIPID PANEL
Chol/HDL Ratio: 3.7 ratio (ref 0.0–4.4)
Cholesterol, Total: 219 mg/dL — ABNORMAL HIGH (ref 100–199)
HDL: 59 mg/dL (ref >39)
LDL Calculated: 138 mg/dL — ABNORMAL HIGH (ref 0–99)
Triglycerides: 111 mg/dL (ref 0–149)
VLDL Cholesterol Cal: 22 mg/dL (ref 5–40)

## 2018-10-07 LAB — VITAMIN D 25 HYDROXY (VIT D DEFICIENCY, FRACTURES): Vit D, 25-Hydroxy: 7.1 ng/mL — ABNORMAL LOW (ref 30.0–100.0)

## 2018-10-07 LAB — TSH: TSH: 0.653 u[IU]/mL (ref 0.450–4.500)

## 2018-10-07 LAB — HEMOGLOBIN A1C
Est. average glucose Bld gHb Est-mCnc: 128 mg/dL
HEMOGLOBIN A1C: 6.1 % — AB (ref 4.8–5.6)

## 2018-10-07 MED ORDER — MELOXICAM 15 MG PO TABS: 15.0000 mg | ORAL_TABLET | Freq: Every day | ORAL | 3 refills | Status: DC

## 2018-10-07 NOTE — Telephone Encounter (Signed)
Per Dr. Logan Bores verbal order, ok to refill Meloxicam.  Script has been sent to pharmacy and patient has been notified.

## 2018-10-07 NOTE — Telephone Encounter (Signed)
Pt called requesting refill of Meloxicam.

## 2018-10-07 NOTE — Addendum Note (Signed)
Addended by: Geraldine Contras D on: 10/07/2018 01:23 PM   Modules accepted: Orders

## 2018-10-08 ENCOUNTER — Telehealth: Payer: Self-pay

## 2018-10-08 MED ORDER — MELOXICAM 15 MG PO TABS: 15.0000 mg | ORAL_TABLET | Freq: Every day | ORAL | 3 refills | Status: DC

## 2018-10-08 MED FILL — MELOXICAM 15 MG TABLET: 15 | 30 days supply | Qty: 30 | Fill #0

## 2018-10-08 NOTE — Telephone Encounter (Signed)
Medication has been sent to [facility] outpt pharmacy

## 2018-10-14 ENCOUNTER — Encounter: Payer: Self-pay | Admitting: Nurse Practitioner

## 2018-10-14 ENCOUNTER — Other Ambulatory Visit: Payer: Self-pay | Admitting: Nurse Practitioner

## 2018-10-14 MED ORDER — VITAMIN D (ERGOCALCIFEROL) 1.25 MG (50000 UNIT) PO CAPS: 50000.0000 [IU] | ORAL_CAPSULE | ORAL | 1 refills | Status: DC

## 2018-10-15 MED FILL — VIT D2 1.25 MG (50,000 UNIT: 1.25 MG | 84 days supply | Qty: 24 | Fill #0

## 2018-10-21 ENCOUNTER — Encounter: Payer: Self-pay | Admitting: Nurse Practitioner

## 2018-10-23 ENCOUNTER — Encounter: Payer: Self-pay | Admitting: Nurse Practitioner

## 2018-10-23 ENCOUNTER — Ambulatory Visit: Payer: 59 | Admitting: Nurse Practitioner

## 2018-10-23 VITALS — BP 148/102 | HR 85 | Temp 98.5°F | Ht 70.0 in | Wt 351.0 lb

## 2018-10-23 DIAGNOSIS — Z6841 Body Mass Index (BMI) 40.0 and over, adult: Secondary | ICD-10-CM | POA: Diagnosis not present

## 2018-10-23 DIAGNOSIS — R7303 Prediabetes: Secondary | ICD-10-CM | POA: Diagnosis not present

## 2018-10-23 DIAGNOSIS — I10 Essential (primary) hypertension: Secondary | ICD-10-CM | POA: Diagnosis not present

## 2018-10-23 MED ORDER — AMLODIPINE BESYLATE 5 MG PO TABS: 5.0000 mg | ORAL_TABLET | Freq: Every day | ORAL | 2 refills | Status: DC

## 2018-10-23 MED ORDER — AMLODIPINE BESYLATE 5 MG PO TABS: 5.0000 mg | ORAL_TABLET | Freq: Every day | ORAL | 11 refills | Status: DC

## 2018-10-23 MED FILL — AMLODIPINE BESYLATE 5 MG TA: 5 | 30 days supply | Qty: 30 | Fill #0

## 2018-10-23 NOTE — Progress Notes (Signed)
Subjective:     Patient ID: Leslie Mendoza , female    DOB: [birth year] , 38 y.o.   MRN: [id number]   Chief Complaint  Patient presents with  . other    2 wk follow up on b/p    HPI  Here for recheck blood pressure from 10/06/2018 visit , started on hydrochlorthiazide and continuing with mobic due to ankle pain.  She denies any side effects from the HCTZ.  Denies any symptoms.    Past Medical History:  Diagnosis Date  . Achilles tendinitis   . Hypertension      Family History  Problem Relation Age of Onset  . Cancer Mother   . Hypertension Mother   . Heart disease Father   . Diabetes Father   . COPD Brother      Current Outpatient Medications:  .  hydrochlorothiazide (HYDRODIURIL) 12.5 MG tablet, Take 1 tablet (12.5 mg total) by mouth daily., Disp: 30 tablet, Rfl: 2 .  meloxicam (MOBIC) 15 MG tablet, Take 1 tablet (15 mg total) by mouth daily., Disp: 30 tablet, Rfl: 3 .  Vitamin D, Ergocalciferol, (DRISDOL) 1.25 MG (50000 UT) CAPS capsule, Take 1 capsule (50,000 Units total) by mouth 2 (two) times a week., Disp: 24 capsule, Rfl: 1   Allergies  Allergen Reactions  . Pineapple     hives     Review of Systems  Constitutional: Negative for fatigue and fever.  Respiratory: Negative.  Negative for cough.   Cardiovascular: Negative.  Negative for chest pain, palpitations and leg swelling.  Gastrointestinal: Negative for nausea.  Genitourinary: Negative for difficulty urinating and dysuria.  Neurological: Negative for dizziness and headaches.     Today's Vitals   10/23/18 0902 10/23/18 0905  BP: (!) 156/102 (!) 148/102  Pulse: 85   Temp: 98.5 F (36.9 C)   SpO2: 95%   Weight: (!) 351 lb (159.2 kg)   Height: 5\' 10"  (1.778 m)    Body mass index is 50.36 kg/m.   Objective:  Physical Exam Constitutional:      Appearance: Normal appearance.  Cardiovascular:     Rate and Rhythm: Normal rate and regular rhythm.     Pulses: Normal pulses.     Heart sounds:  Normal heart sounds. No murmur.  Pulmonary:     Effort: Pulmonary effort is normal. No respiratory distress.     Breath sounds: Normal breath sounds. No wheezing or rales.  Neurological:     Mental Status: She is alert.         Assessment And Plan:     1. Essential hypertension  Blood pressure is elevated still with minimal decrease with HCTZ  Will start amlodipine 5mg  daily as an adjunct.  Discussed side effects of swelling lower extremities, allow few weeks to resolve  Encouraged to drink at least 1/2 body weight in ounces and to eat a low salt diet (DASH) diet information given.  I have also advised her to check her blood pressure daily and keep a log. - amLODipine (NORVASC) 5 MG tablet; Take 1 tablet (5 mg total) by mouth daily.  Dispense: 30 tablet; Refill: 2  2. Class 3 severe obesity due to excess calories with serious comorbidity and body mass index (BMI) of 50.0 to 59.9 in adult [facility])  Chronic  Discussed healthy diet and regular exercise options   Encouraged to exercise at least 150 minutes per week with 2 days of strength training  We will try to discuss continually her goals to  lose weight which can help her blood pressure.  3. Prediabetes  Chronic,   No current medications  Encouraged to limit intake of sugary foods and drinks  Encouraged to increase physical activity throughout the day with taking stairs, parking further when at stores.  [name], FNP

## 2018-10-23 NOTE — Patient Instructions (Signed)
DASH Eating Plan DASH stands for "Dietary Approaches to Stop Hypertension." The DASH eating plan is a healthy eating plan that has been shown to reduce high blood pressure (hypertension). It may also reduce your risk for type 2 diabetes, heart disease, and stroke. The DASH eating plan may also help with weight loss. What are tips for following this plan?  General guidelines  Avoid eating more than 2,300 mg (milligrams) of salt (sodium) a day. If you have hypertension, you may need to reduce your sodium intake to 1,500 mg a day.  Limit alcohol intake to no more than 1 drink a day for nonpregnant women and 2 drinks a day for men. One drink equals 12 oz of beer, 5 oz of wine, or 1 oz of hard liquor.  Work with your health care provider to maintain a healthy body weight or to lose weight. Ask what an ideal weight is for you.  Get at least 30 minutes of exercise that causes your heart to beat faster (aerobic exercise) most days of the week. Activities may include walking, swimming, or biking.  Work with your health care provider or diet and nutrition specialist (dietitian) to adjust your eating plan to your individual calorie needs. Reading food labels   Check food labels for the amount of sodium per serving. Choose foods with less than 5 percent of the Daily Value of sodium. Generally, foods with less than 300 mg of sodium per serving fit into this eating plan.  To find whole grains, look for the word "whole" as the first word in the ingredient list. Shopping  Buy products labeled as "low-sodium" or "no salt added."  Buy fresh foods. Avoid canned foods and premade or frozen meals. Cooking  Avoid adding salt when cooking. Use salt-free seasonings or herbs instead of table salt or sea salt. Check with your health care provider or pharmacist before using salt substitutes.  Do not fry foods. Cook foods using healthy methods such as baking, boiling, grilling, and broiling instead.  Cook with  heart-healthy oils, such as olive, canola, soybean, or sunflower oil. Meal planning  Eat a balanced diet that includes: ? 5 or more servings of fruits and vegetables each day. At each meal, try to fill half of your plate with fruits and vegetables. ? Up to 6-8 servings of whole grains each day. ? Less than 6 oz of lean meat, poultry, or fish each day. A 3-oz serving of meat is about the same size as a deck of cards. One egg equals 1 oz. ? 2 servings of low-fat dairy each day. ? A serving of nuts, seeds, or beans 5 times each week. ? Heart-healthy fats. Healthy fats called Omega-3 fatty acids are found in foods such as flaxseeds and coldwater fish, like sardines, salmon, and mackerel.  Limit how much you eat of the following: ? Canned or prepackaged foods. ? Food that is high in trans fat, such as fried foods. ? Food that is high in saturated fat, such as fatty meat. ? Sweets, desserts, sugary drinks, and other foods with added sugar. ? Full-fat dairy products.  Do not salt foods before eating.  Try to eat at least 2 vegetarian meals each week.  Eat more home-cooked food and less restaurant, buffet, and fast food.  When eating at a restaurant, ask that your food be prepared with less salt or no salt, if possible. What foods are recommended? The items listed may not be a complete list. Talk with your dietitian about   what dietary choices are best for you. Grains Whole-grain or whole-wheat bread. Whole-grain or whole-wheat pasta. Brown rice. Oatmeal. Quinoa. Bulgur. Whole-grain and low-sodium cereals. Pita bread. Low-fat, low-sodium crackers. Whole-wheat flour tortillas. Vegetables Fresh or frozen vegetables (raw, steamed, roasted, or grilled). Low-sodium or reduced-sodium tomato and vegetable juice. Low-sodium or reduced-sodium tomato sauce and tomato paste. Low-sodium or reduced-sodium canned vegetables. Fruits All fresh, dried, or frozen fruit. Canned fruit in natural juice (without  added sugar). Meat and other protein foods Skinless chicken or turkey. Ground chicken or turkey. Pork with fat trimmed off. Fish and seafood. Egg whites. Dried beans, peas, or lentils. Unsalted nuts, nut butters, and seeds. Unsalted canned beans. Lean cuts of beef with fat trimmed off. Low-sodium, lean deli meat. Dairy Low-fat (1%) or fat-free (skim) milk. Fat-free, low-fat, or reduced-fat cheeses. Nonfat, low-sodium ricotta or cottage cheese. Low-fat or nonfat yogurt. Low-fat, low-sodium cheese. Fats and oils Soft margarine without trans fats. Vegetable oil. Low-fat, reduced-fat, or light mayonnaise and salad dressings (reduced-sodium). Canola, safflower, olive, soybean, and sunflower oils. Avocado. Seasoning and other foods Herbs. Spices. Seasoning mixes without salt. Unsalted popcorn and pretzels. Fat-free sweets. What foods are not recommended? The items listed may not be a complete list. Talk with your dietitian about what dietary choices are best for you. Grains Baked goods made with fat, such as croissants, muffins, or some breads. Dry pasta or rice meal packs. Vegetables Creamed or fried vegetables. Vegetables in a cheese sauce. Regular canned vegetables (not low-sodium or reduced-sodium). Regular canned tomato sauce and paste (not low-sodium or reduced-sodium). Regular tomato and vegetable juice (not low-sodium or reduced-sodium). Pickles. Olives. Fruits Canned fruit in a light or heavy syrup. Fried fruit. Fruit in cream or butter sauce. Meat and other protein foods Fatty cuts of meat. Ribs. Fried meat. Bacon. Sausage. Bologna and other processed lunch meats. Salami. Fatback. Hotdogs. Bratwurst. Salted nuts and seeds. Canned beans with added salt. Canned or smoked fish. Whole eggs or egg yolks. Chicken or turkey with skin. Dairy Whole or 2% milk, cream, and half-and-half. Whole or full-fat cream cheese. Whole-fat or sweetened yogurt. Full-fat cheese. Nondairy creamers. Whipped toppings.  Processed cheese and cheese spreads. Fats and oils Butter. Stick margarine. Lard. Shortening. Ghee. Bacon fat. Tropical oils, such as coconut, palm kernel, or palm oil. Seasoning and other foods Salted popcorn and pretzels. Onion salt, garlic salt, seasoned salt, table salt, and sea salt. Worcestershire sauce. Tartar sauce. Barbecue sauce. Teriyaki sauce. Soy sauce, including reduced-sodium. Steak sauce. Canned and packaged gravies. Fish sauce. Oyster sauce. Cocktail sauce. Horseradish that you find on the shelf. Ketchup. Mustard. Meat flavorings and tenderizers. Bouillon cubes. Hot sauce and Tabasco sauce. Premade or packaged marinades. Premade or packaged taco seasonings. Relishes. Regular salad dressings. Where to find more information:  National Heart, Lung, and Blood Institute: www.nhlbi.nih.gov  American Heart Association: www.heart.org Summary  The DASH eating plan is a healthy eating plan that has been shown to reduce high blood pressure (hypertension). It may also reduce your risk for type 2 diabetes, heart disease, and stroke.  With the DASH eating plan, you should limit salt (sodium) intake to 2,300 mg a day. If you have hypertension, you may need to reduce your sodium intake to 1,500 mg a day.  When on the DASH eating plan, aim to eat more fresh fruits and vegetables, whole grains, lean proteins, low-fat dairy, and heart-healthy fats.  Work with your health care provider or diet and nutrition specialist (dietitian) to adjust your eating plan to your   DASH eating plan, you should limit salt (sodium) intake to 2,300 mg a day. If you have hypertension, you may need to reduce your sodium intake to 1,500 mg a day.   When on the DASH eating plan, aim to eat more fresh fruits and vegetables, whole grains, lean proteins, low-fat dairy, and heart-healthy fats.   Work with your health care provider or diet and nutrition specialist (dietitian) to adjust your eating plan to your individual calorie needs.  This information is not intended to replace advice given to you by your health care provider. Make sure you discuss any questions you have with your health care provider.  Document Released: 08/02/2011 Document Revised: 08/06/2016 Document Reviewed: 08/06/2016  Elsevier Interactive Patient Education  2019 [employer]    Healthy Eating  Following a healthy eating pattern may help you to achieve and maintain a  healthy body weight, reduce the risk of chronic disease, and live a long and productive life. It is important to follow a healthy eating pattern at an appropriate calorie level for your body. Your nutritional needs should be met primarily through food by choosing a variety of nutrient-rich foods.  What are tips for following this plan?  Reading food labels   Read labels and choose the following:  ? Reduced or low sodium.  ? Juices with 100% fruit juice.  ? Foods with low saturated fats and high polyunsaturated and monounsaturated fats.  ? Foods with whole grains, such as whole wheat, cracked wheat, brown rice, and wild rice.  ? Whole grains that are fortified with folic acid. This is recommended for women who are pregnant or who want to become pregnant.   Read labels and avoid the following:  ? Foods with a lot of added sugars. These include foods that contain brown sugar, corn sweetener, corn syrup, dextrose, fructose, glucose, high-fructose corn syrup, honey, invert sugar, lactose, malt syrup, maltose, molasses, raw sugar, sucrose, trehalose, or turbinado sugar.   Do not eat more than the following amounts of added sugar per day:   6 teaspoons (25 g) for women.   9 teaspoons (38 g) for men.  ? Foods that contain processed or refined starches and grains.  ? Refined grain products, such as white flour, degermed cornmeal, white bread, and white rice.  Shopping   Choose nutrient-rich snacks, such as vegetables, whole fruits, and nuts. Avoid high-calorie and high-sugar snacks, such as potato chips, fruit snacks, and candy.   Use oil-based dressings and spreads on foods instead of solid fats such as butter, stick margarine, or cream cheese.   Limit pre-made sauces, mixes, and "instant" products such as flavored rice, instant noodles, and ready-made pasta.   Try more plant-protein sources, such as tofu, tempeh, black beans, edamame, lentils, nuts, and seeds.   Explore eating plans such as the Mediterranean diet  or vegetarian diet.  Cooking   Use oil to saut or stir-fry foods instead of solid fats such as butter, stick margarine, or lard.   Try baking, boiling, grilling, or broiling instead of frying.   Remove the fatty part of meats before cooking.   Steam vegetables in water or broth.  Meal planning     At meals, imagine dividing your plate into fourths:  ? One-half of your plate is fruits and vegetables.  ? One-fourth of your plate is whole grains.  ? One-fourth of your plate is protein, especially lean meats, poultry, eggs, tofu, beans, or nuts.   Include low-fat dairy as part of your   been between the temperatures of 40-140F (4.4-60C) for more than 2 hours. What foods should I eat? Fruits Aim to eat 2 cup-equivalents of fresh, canned (in natural juice), or frozen fruits each day. Examples of 1 cup-equivalent of fruit include 1 small apple, 8 large strawberries, 1 cup canned fruit,  cup dried fruit, or 1 cup 100% juice. Vegetables Aim to eat 2-3 cup-equivalents of fresh and frozen vegetables each day, including different varieties and colors. Examples of 1 cup-equivalent of vegetables include 2 medium carrots, 2 cups raw, leafy greens, 1 cup chopped vegetable (raw or cooked), or 1 medium baked potato. Grains Aim to eat 6  ounce-equivalents of whole grains each day. Examples of 1 ounce-equivalent of grains include 1 slice of bread, 1 cup ready-to-eat cereal, 3 cups popcorn, or  cup cooked rice, pasta, or cereal. Meats and other proteins Aim to eat 5-6 ounce-equivalents of protein each day. Examples of 1 ounce-equivalent of protein include 1 egg, 1/2 cup nuts or seeds, or 1 tablespoon (16 g) peanut butter. A cut of meat or fish that is the size of a deck of cards is about 3-4 ounce-equivalents.  Of the protein you eat each week, try to have at least 8 ounces come from seafood. This includes salmon, trout, herring, and anchovies. Dairy Aim to eat 3 cup-equivalents of fat-free or low-fat dairy each day. Examples of 1 cup-equivalent of dairy include 1 cup (240 mL) milk, 8 ounces (250 g) yogurt, 1 ounces (44 g) natural cheese, or 1 cup (240 mL) fortified soy milk. Fats and oils  Aim for about 5 teaspoons (21 g) per day. Choose monounsaturated fats, such as canola and olive oils, avocados, peanut butter, and most nuts, or polyunsaturated fats, such as sunflower, corn, and soybean oils, walnuts, pine nuts, sesame seeds, sunflower seeds, and flaxseed. Beverages  Aim for six 8-oz glasses of water per day. Limit coffee to three to five 8-oz cups per day.  Limit caffeinated beverages that have added calories, such as soda and energy drinks.  Limit alcohol intake to no more than 1 drink a day for nonpregnant women and 2 drinks a day for men. One drink equals 12 oz of beer (355 mL), 5 oz of wine (148 mL), or 1 oz of hard liquor (44 mL). Seasoning and other foods  Avoid adding excess amounts of salt to your foods. Try flavoring foods with herbs and spices instead of salt.  Avoid adding sugar to foods.  Try using oil-based dressings, sauces, and spreads instead of solid fats. This information is based on general [place] nutrition guidelines. For more information, visit Build[employer].es. Exact amounts may vary based on your  nutrition needs. Summary  A healthy eating plan may help you to maintain a healthy weight, reduce the risk of chronic diseases, and stay active throughout your life.  Plan your meals. Make sure you eat the right portions of a variety of nutrient-rich foods.  Try baking, boiling, grilling, or broiling instead of frying.  Choose healthy options in all settings, including home, work, school, restaurants, or stores. This information is not intended to replace advice given to you by your health care provider. Make sure you discuss any questions you have with your health care provider. Document Released: 11/25/2017 Document Revised: 11/25/2017 Document Reviewed: 11/25/2017 Elsevier Interactive Patient Education  2019 [employer].

## 2018-11-03 MED FILL — HYDROCHLOROTHIAZIDE 12.5 MG: 12.5 | 30 days supply | Qty: 30 | Fill #1

## 2018-11-04 MED FILL — MELOXICAM 15 MG TABLET: 15 | 30 days supply | Qty: 30 | Fill #1

## 2018-11-07 ENCOUNTER — Other Ambulatory Visit: Payer: Self-pay

## 2018-11-07 ENCOUNTER — Ambulatory Visit: Payer: 59

## 2018-11-07 VITALS — BP 134/84 | HR 78 | Temp 98.3°F | Ht 70.0 in | Wt 348.8 lb

## 2018-11-07 DIAGNOSIS — I10 Essential (primary) hypertension: Secondary | ICD-10-CM

## 2018-11-14 ENCOUNTER — Encounter: Payer: Self-pay | Admitting: Nurse Practitioner

## 2018-11-19 MED FILL — AMLODIPINE BESYLATE 5 MG TA: 5 | 30 days supply | Qty: 30 | Fill #1

## 2018-11-21 ENCOUNTER — Encounter: Payer: Self-pay | Admitting: Nurse Practitioner

## 2018-11-22 MED FILL — AMLODIPINE BESYLATE 5 MG TA: 5 | 30 days supply | Qty: 30 | Fill #2

## 2018-11-22 MED FILL — MELOXICAM 15 MG TABLET: 15 | 30 days supply | Qty: 30 | Fill #2

## 2018-11-22 MED FILL — HYDROCHLOROTHIAZIDE 12.5 MG: 12.5 | 30 days supply | Qty: 30 | Fill #2

## 2018-12-04 ENCOUNTER — Encounter: Payer: Self-pay | Admitting: Nurse Practitioner

## 2018-12-04 ENCOUNTER — Ambulatory Visit: Payer: 59 | Admitting: Nurse Practitioner

## 2018-12-04 ENCOUNTER — Other Ambulatory Visit: Payer: Self-pay

## 2018-12-04 VITALS — BP 130/78 | HR 95 | Temp 98.9°F | Ht 70.0 in | Wt 352.2 lb

## 2018-12-04 DIAGNOSIS — Z6841 Body Mass Index (BMI) 40.0 and over, adult: Secondary | ICD-10-CM

## 2018-12-04 DIAGNOSIS — I10 Essential (primary) hypertension: Secondary | ICD-10-CM

## 2018-12-04 NOTE — Progress Notes (Signed)
Subjective:     Patient ID: Leslie Mendoza , female    DOB: 08/15/1981 , 38 y.o.   MRN: [id number]   Chief Complaint  Patient presents with  . Hypertension    HPI  Here for blood pressure follow up.  She started amlodipine denies swelling or issues with the medication.  Admits to not drinking enough water.    Hypertension  This is a chronic problem. The current episode started more than 1 month ago. The problem is controlled. Pertinent negatives include no chest pain, headaches or palpitations. Risk factors for coronary artery disease include obesity and sedentary lifestyle. Past treatments include calcium channel blockers and diuretics. There are no compliance problems.  There is no history of angina. There is no history of chronic renal disease.     Past Medical History:  Diagnosis Date  . Achilles tendinitis   . Hypertension      Family History  Problem Relation Age of Onset  . Cancer Mother   . Hypertension Mother   . Heart disease Father   . Diabetes Father   . COPD Brother      Current Outpatient Medications:  .  amLODipine (NORVASC) 5 MG tablet, Take 1 tablet (5 mg total) by mouth daily., Disp: 30 tablet, Rfl: 2 .  hydrochlorothiazide (HYDRODIURIL) 12.5 MG tablet, Take 1 tablet (12.5 mg total) by mouth daily., Disp: 30 tablet, Rfl: 2 .  meloxicam (MOBIC) 15 MG tablet, Take 1 tablet (15 mg total) by mouth daily., Disp: 30 tablet, Rfl: 3 .  Vitamin D, Ergocalciferol, (DRISDOL) 1.25 MG (50000 UT) CAPS capsule, Take 1 capsule (50,000 Units total) by mouth 2 (two) times a week., Disp: 24 capsule, Rfl: 1   Allergies  Allergen Reactions  . Pineapple     hives     Review of Systems  Constitutional: Negative for fatigue and fever.  Respiratory: Negative.  Negative for cough.   Cardiovascular: Negative.  Negative for chest pain, palpitations and leg swelling.  Gastrointestinal: Negative for nausea.  Genitourinary: Negative for difficulty urinating and dysuria.   Neurological: Negative for dizziness and headaches.     Today's Vitals   12/04/18 0917  BP: 130/78  Pulse: 95  Temp: 98.9 F (37.2 C)  TempSrc: Oral  SpO2: 97%  Weight: (!) 352 lb 3.2 oz (159.8 kg)  Height: 5\' 10"  (1.778 m)   Body mass index is 50.54 kg/m.   Objective:  Physical Exam Constitutional:      Appearance: Normal appearance.  Cardiovascular:     Rate and Rhythm: Normal rate and regular rhythm.     Pulses: Normal pulses.     Heart sounds: Normal heart sounds. No murmur.  Pulmonary:     Effort: Pulmonary effort is normal. No respiratory distress.     Breath sounds: Normal breath sounds. No wheezing or rales.  Neurological:     Mental Status: She is alert.         Assessment And Plan:     1. Essential hypertension  Blood pressure is improved and tolerating amlodipine well.   Encouraged to drink at least 1/2 body weight in ounces and to eat a low salt diet (DASH) diet information given.  I have also advised her to check her blood pressure daily and keep a log.  2. Class 3 severe obesity due to excess calories with serious comorbidity and body mass index (BMI) of 50.0 to 59.9 in adult [facility])  Congratulated her on losing 5lbs since her last visit.  Continue to focus on healthy diet and increasing physical activity.   Arnette FeltsJanece Prudencio Velazco, FNP

## 2018-12-08 MED FILL — VIT D2 1.25 MG (50,000 UNIT: 1.25 MG | 84 days supply | Qty: 24 | Fill #1

## 2018-12-25 ENCOUNTER — Other Ambulatory Visit: Payer: Self-pay | Admitting: Nurse Practitioner

## 2018-12-25 DIAGNOSIS — I10 Essential (primary) hypertension: Secondary | ICD-10-CM

## 2018-12-25 MED FILL — HYDROCHLOROTHIAZIDE 12.5 MG: 12.5 | 30 days supply | Qty: 30 | Fill #0

## 2018-12-25 MED FILL — MELOXICAM 15 MG TABLET: 15 | 30 days supply | Qty: 30 | Fill #3

## 2018-12-25 MED FILL — AMLODIPINE BESYLATE 5 MG TA: 5 | 30 days supply | Qty: 30 | Fill #3

## 2019-01-05 ENCOUNTER — Encounter: Payer: 59 | Admitting: Nurse Practitioner

## 2019-01-05 ENCOUNTER — Other Ambulatory Visit: Payer: Self-pay

## 2019-01-05 ENCOUNTER — Encounter: Payer: Self-pay | Admitting: Nurse Practitioner

## 2019-01-05 ENCOUNTER — Other Ambulatory Visit: Payer: Self-pay | Admitting: Nurse Practitioner

## 2019-01-05 ENCOUNTER — Other Ambulatory Visit (HOSPITAL_COMMUNITY)
Admission: RE | Admit: 2019-01-05 | Discharge: 2019-01-05 | Disposition: A | Discharge status: Home / Self Care | Payer: 59 | Source: Ambulatory Visit | Attending: Nurse Practitioner | Admitting: Nurse Practitioner

## 2019-01-05 ENCOUNTER — Ambulatory Visit: Payer: 59 | Admitting: Nurse Practitioner

## 2019-01-05 VITALS — BP 114/80 | HR 75 | Temp 98.7°F | Ht 69.8 in | Wt 350.6 lb

## 2019-01-05 DIAGNOSIS — N912 Amenorrhea, unspecified: Secondary | ICD-10-CM

## 2019-01-05 DIAGNOSIS — Z0000 Encounter for general adult medical examination without abnormal findings: Secondary | ICD-10-CM

## 2019-01-05 DIAGNOSIS — I10 Essential (primary) hypertension: Secondary | ICD-10-CM

## 2019-01-05 DIAGNOSIS — Z113 Encounter for screening for infections with a predominantly sexual mode of transmission: Secondary | ICD-10-CM

## 2019-01-05 DIAGNOSIS — Z124 Encounter for screening for malignant neoplasm of cervix: Secondary | ICD-10-CM | POA: Diagnosis not present

## 2019-01-05 DIAGNOSIS — E559 Vitamin D deficiency, unspecified: Secondary | ICD-10-CM | POA: Diagnosis not present

## 2019-01-05 DIAGNOSIS — Z23 Encounter for immunization: Secondary | ICD-10-CM

## 2019-01-05 DIAGNOSIS — Z6841 Body Mass Index (BMI) 40.0 and over, adult: Secondary | ICD-10-CM | POA: Diagnosis not present

## 2019-01-05 DIAGNOSIS — Z1239 Encounter for other screening for malignant neoplasm of breast: Secondary | ICD-10-CM

## 2019-01-05 DIAGNOSIS — R7303 Prediabetes: Secondary | ICD-10-CM | POA: Diagnosis not present

## 2019-01-05 LAB — POCT URINALYSIS DIPSTICK
Bilirubin, UA: NEGATIVE
Blood, UA: NEGATIVE
Glucose, UA: NEGATIVE
Ketones, UA: NEGATIVE
Nitrite, UA: NEGATIVE
Protein, UA: NEGATIVE
Spec Grav, UA: 1.02 (ref 1.010–1.025)
Urobilinogen, UA: 0.2 E.U./dL
pH, UA: 6 (ref 5.0–8.0)

## 2019-01-05 LAB — POCT UA - MICROALBUMIN
Albumin/Creatinine Ratio, Urine, POC: 30
Creatinine, POC: 300 mg/dL
Microalbumin Ur, POC: 10 mg/L

## 2019-01-05 MED ORDER — TETANUS-DIPHTH-ACELL PERTUSSIS 5-2.5-18.5 LF-MCG/0.5 IM SUSP
0.5000 mL | Freq: Once | INTRAMUSCULAR | Status: AC
  Administered 2019-01-05: 0.5 mL via INTRAMUSCULAR

## 2019-01-05 NOTE — Progress Notes (Addendum)
Subjective:     Patient ID: Leslie Mendoza , female    DOB: 1980/11/17 , 38 y.o.   MRN: [id number]   Chief Complaint  Patient presents with  . Annual Exam   The patient states she uses none for birth control. Last LMP was No LMP recorded. (Menstrual status: Irregular Periods).. Negative for Dysmenorrhea and Negative for Menorrhagia Mammogram never done.  Negative for: breast discharge, breast lump(s), breast pain and breast self exam.  Pertinent negatives include abnormal bleeding (hematology), anxiety, decreased libido, depression, difficulty falling sleep, dyspareunia, history of infertility, nocturia, sexual dysfunction, sleep disturbances, urinary incontinence, urinary urgency, vaginal discharge and vaginal itching. Diet regular.The patient states her exercise level is      The patient's tobacco use is:  Social History   Tobacco Use  Smoking Status Current Every Day Smoker  . Packs/day: 0.50  . Types: Cigarettes  Smokeless Tobacco Never Used   She has been exposed to passive smoke. The patient's alcohol use is:  Social History   Substance and Sexual Activity  Alcohol Use No   Additional information: Last pap she does not remember , next one scheduled for today.  HPI  Here for HM  Amenorrhea - she reports she has not had a menstrual cycle in at least the last 2017.      Past Medical History:  Diagnosis Date  . Achilles tendinitis   . Hypertension      Family History  Problem Relation Age of Onset  . Cancer Mother   . Hypertension Mother   . Heart disease Father   . Diabetes Father   . COPD Brother      Current Outpatient Medications:  .  amLODipine (NORVASC) 5 MG tablet, Take 1 tablet (5 mg total) by mouth daily., Disp: 30 tablet, Rfl: 2 .  hydrochlorothiazide (MICROZIDE) 12.5 MG capsule, TAKE 1 CAPSULE BY MOUTH ONCE DAILY, Disp: 30 capsule, Rfl: 2 .  meloxicam (MOBIC) 15 MG tablet, Take 1 tablet (15 mg total) by mouth daily., Disp: 30 tablet, Rfl: 3 .   Vitamin D, Ergocalciferol, (DRISDOL) 1.25 MG (50000 UT) CAPS capsule, Take 1 capsule (50,000 Units total) by mouth 2 (two) times a week., Disp: 24 capsule, Rfl: 1   Allergies  Allergen Reactions  . Pineapple     hives     Review of Systems  Constitutional: Negative.   HENT: Negative.   Eyes: Negative.   Respiratory: Negative.   Cardiovascular: Negative.  Negative for chest pain, palpitations and leg swelling.  Endocrine: Negative.   Genitourinary: Negative.   Musculoskeletal: Negative.   Skin: Negative.   Neurological: Negative.   Hematological: Negative.   Psychiatric/Behavioral: Negative.      Today's Vitals   01/05/19 1125  BP: 114/80  Pulse: 75  Temp: 98.7 F (37.1 C)  TempSrc: Oral  Weight: (!) 350 lb 9.6 oz (159 kg)  Height: 5' 9.8" (1.773 m)  PainSc: 0-No pain   Body mass index is 50.59 kg/m.   Objective:  Physical Exam Constitutional:      Appearance: Normal appearance. She is well-developed. She is obese.  HENT:     Head: Normocephalic and atraumatic.     Right Ear: Hearing, tympanic membrane, ear canal and external ear normal.     Left Ear: Hearing, tympanic membrane, ear canal and external ear normal.     Nose: Nose normal.     Mouth/Throat:     Mouth: Mucous membranes are moist.  Eyes:     General:  Lids are normal.     Conjunctiva/sclera: Conjunctivae normal.     Pupils: Pupils are equal, round, and reactive to light.     Funduscopic exam:    Right eye: No papilledema.        Left eye: No papilledema.  Neck:     Musculoskeletal: Full passive range of motion without pain, normal range of motion and neck supple.     Thyroid: No thyroid mass.     Vascular: No carotid bruit.  Cardiovascular:     Rate and Rhythm: Normal rate and regular rhythm.     Pulses: Normal pulses.     Heart sounds: Normal heart sounds. No murmur.  Pulmonary:     Effort: Pulmonary effort is normal.     Breath sounds: Normal breath sounds.  Chest:     Chest wall: No mass.      Breasts:        Right: Normal. No mass, nipple discharge or tenderness.        Left: Normal. No mass, nipple discharge or tenderness.  Abdominal:     General: Abdomen is flat. Bowel sounds are normal.     Palpations: Abdomen is soft.  Genitourinary:    General: Normal vulva.     Vagina: Normal. No vaginal discharge.     Cervix: Normal.     Uterus: Normal.      Adnexa: Right adnexa normal and left adnexa normal.  Musculoskeletal: Normal range of motion.        General: No swelling.     Right lower leg: No edema.     Left lower leg: No edema.  Lymphadenopathy:     Upper Body:     Right upper body: No supraclavicular, axillary or pectoral adenopathy.     Left upper body: No supraclavicular, axillary or pectoral adenopathy.  Skin:    General: Skin is warm and dry.     Capillary Refill: Capillary refill takes less than 2 seconds.  Neurological:     General: No focal deficit present.     Mental Status: She is alert and oriented to person, place, and time.     Cranial Nerves: No cranial nerve deficit.     Sensory: No sensory deficit.  Psychiatric:        Mood and Affect: Mood normal.        Behavior: Behavior normal.        Thought Content: Thought content normal.        Judgment: Judgment normal.         Assessment And Plan:     1. Essential hypertension . B/P is better controlled.  . CMP ordered to check renal function.  . The importance of regular exercise and dietary modification was stressed to the patient.  . Stressed importance of losing ten percent of her body weight to help with B/P control.  . The weight loss would help with decreasing cardiac and cancer risk as well.  - EKG 12-Lead - POCT Urinalysis Dipstick (81002) - POCT UA - Microalbumin  2. Prediabetes  Chronic, slightly elevated at last visit  Continue with current medications, she is tolerating metformin well  Encouraged to limit intake of sugary foods and drinks  Encouraged to increase physical  activity to 150 minutes per week - Hemoglobin A1c  3. Class 3 severe obesity due to excess calories with serious comorbidity and body mass index (BMI) of 50.0 to 59.9 in adult [facility])  Chronic  Discussed healthy diet and regular exercise  options   Encouraged to exercise at least 150 minutes per week with 2 days of strength training - Hemoglobin A1c - Insulin, random(561)  4. Amenorrhea  Reports not having a menstrual cycle in at least 3 years  Will check hormone levels  PCOS vs early menopause - FSH/LH  5. Encounter for immunization Will give tetanus vaccine today while in office. Refer to order management. TDAP will be administered to adults 50-64 years old every 10 years. - Tdap (BOOSTRIX) injection 0.5 mL  6. Vitamin D deficiency  Will check vitamin D level and supplement as needed.     Also encouraged to spend 15 minutes in the sun daily.  - Vitamin D (25 hydroxy)  7. Encounter for screening for malignant neoplasm of breast  Pt instructed on Self Breast Exam.According to ACOG guidelines Women aged 29 and older are recommended to get an annual mammogram. Form completed and given to patient contact the [facility] for appointment scheduing.   Pt encouraged to get annual mammogram - MM Digital Screening; Future  8. Screening examination for STD (sexually transmitted disease)  - HIV antibody (with reflex) - T pallidum Screening C[facility] - Cervicovaginal ancillary only  9. Encounter for Papanicolaou smear of cervix  PAP done, no abnormal findings - Cytology -Pap Smear  10. Health maintenance examination . Behavior modifications discussed and diet history reviewed.   . Pt will continue to exercise regularly and modify diet with low GI, plant based foods and decrease intake of processed foods.  . Recommend intake of daily multivitamin, Vitamin D, and calcium.  . Recommend immunizations that include TDAP          [name], FNP    THE PATIENT IS  ENCOURAGED TO PRACTICE SOCIAL DISTANCING DUE TO THE COVID-19 PANDEMIC.

## 2019-01-06 LAB — VITAMIN D 25 HYDROXY (VIT D DEFICIENCY, FRACTURES): Vit D, 25-Hydroxy: 41.7 ng/mL (ref 30.0–100.0)

## 2019-01-06 LAB — FSH/LH
FSH: 36.2 m[IU]/mL
LH: 29.1 m[IU]/mL

## 2019-01-06 LAB — HEMOGLOBIN A1C
Est. average glucose Bld gHb Est-mCnc: 137 mg/dL
Hgb A1c MFr Bld: 6.4 % — ABNORMAL HIGH (ref 4.8–5.6)

## 2019-01-06 LAB — INSULIN, RANDOM: INSULIN: 24.3 u[IU]/mL (ref 2.6–24.9)

## 2019-01-07 LAB — CERVICOVAGINAL ANCILLARY ONLY
Bacterial vaginitis: POSITIVE — AB
Candida vaginitis: NEGATIVE
Chlamydia: NEGATIVE
Neisseria Gonorrhea: NEGATIVE
Trichomonas: NEGATIVE

## 2019-01-08 LAB — CYTOLOGY - PAP
Adequacy: ABSENT
Diagnosis: NEGATIVE

## 2019-01-08 MED ORDER — METRONIDAZOLE 500 MG PO TABS: 500.0000 mg | ORAL_TABLET | Freq: Two times a day (BID) | ORAL | 0 refills | Status: DC

## 2019-01-08 MED FILL — METRONIDAZOLE 500 MG TABS: 500 | 10 days supply | Qty: 20 | Fill #0

## 2019-01-12 ENCOUNTER — Other Ambulatory Visit: Payer: Self-pay | Admitting: Nurse Practitioner

## 2019-01-12 DIAGNOSIS — B9689 Other specified bacterial agents as the cause of diseases classified elsewhere: Secondary | ICD-10-CM

## 2019-01-12 DIAGNOSIS — N760 Acute vaginitis: Secondary | ICD-10-CM

## 2019-01-13 ENCOUNTER — Other Ambulatory Visit: Payer: Self-pay | Admitting: Nurse Practitioner

## 2019-01-13 DIAGNOSIS — B9689 Other specified bacterial agents as the cause of diseases classified elsewhere: Secondary | ICD-10-CM

## 2019-01-14 ENCOUNTER — Other Ambulatory Visit: Payer: Self-pay | Admitting: Nurse Practitioner

## 2019-01-14 DIAGNOSIS — B9689 Other specified bacterial agents as the cause of diseases classified elsewhere: Secondary | ICD-10-CM

## 2019-01-20 MED FILL — HYDROCHLOROTHIAZIDE 12.5 MG: 12.5 | 30 days supply | Qty: 30 | Fill #1

## 2019-01-22 ENCOUNTER — Encounter: Payer: Self-pay | Admitting: Nurse Practitioner

## 2019-01-22 NOTE — Addendum Note (Signed)
Addended by: [name] F on: 01/22/2019 01:56 PM   Modules accepted: Orders

## 2019-01-24 MED FILL — AMLODIPINE BESYLATE 5 MG TA: 5 | 30 days supply | Qty: 30 | Fill #4

## 2019-01-25 ENCOUNTER — Encounter: Payer: Self-pay | Admitting: Nurse Practitioner

## 2019-01-26 ENCOUNTER — Other Ambulatory Visit: Payer: Self-pay | Admitting: Nurse Practitioner

## 2019-01-26 DIAGNOSIS — Z3169 Encounter for other general counseling and advice on procreation: Secondary | ICD-10-CM

## 2019-01-26 DIAGNOSIS — N912 Amenorrhea, unspecified: Secondary | ICD-10-CM

## 2019-02-06 ENCOUNTER — Other Ambulatory Visit: Payer: Self-pay | Admitting: Podiatry

## 2019-02-16 ENCOUNTER — Other Ambulatory Visit: Payer: Self-pay | Admitting: Obstetrics and Gynecology

## 2019-02-16 DIAGNOSIS — L680 Hirsutism: Secondary | ICD-10-CM | POA: Diagnosis not present

## 2019-02-16 DIAGNOSIS — Z3200 Encounter for pregnancy test, result unknown: Secondary | ICD-10-CM | POA: Diagnosis not present

## 2019-02-16 DIAGNOSIS — N911 Secondary amenorrhea: Secondary | ICD-10-CM | POA: Diagnosis not present

## 2019-02-17 MED FILL — HYDROCHLOROTHIAZIDE 12.5 MG: 12.5 | 30 days supply | Qty: 30 | Fill #2

## 2019-02-21 ENCOUNTER — Ambulatory Visit
Admission: RE | Admit: 2019-02-21 | Discharge: 2019-02-21 | Disposition: A | Discharge status: Home / Self Care | Payer: 59 | Source: Ambulatory Visit | Attending: Nurse Practitioner | Admitting: Nurse Practitioner

## 2019-02-21 ENCOUNTER — Other Ambulatory Visit: Payer: Self-pay

## 2019-02-21 DIAGNOSIS — Z1231 Encounter for screening mammogram for malignant neoplasm of breast: Secondary | ICD-10-CM | POA: Diagnosis not present

## 2019-02-21 DIAGNOSIS — Z1239 Encounter for other screening for malignant neoplasm of breast: Secondary | ICD-10-CM

## 2019-02-23 MED FILL — AMLODIPINE BESYLATE 5 MG TA: 5 | 30 days supply | Qty: 30 | Fill #5

## 2019-02-24 ENCOUNTER — Other Ambulatory Visit: Payer: Self-pay | Admitting: Nurse Practitioner

## 2019-02-24 DIAGNOSIS — R928 Other abnormal and inconclusive findings on diagnostic imaging of breast: Secondary | ICD-10-CM

## 2019-03-03 ENCOUNTER — Other Ambulatory Visit: Payer: Self-pay

## 2019-03-03 ENCOUNTER — Ambulatory Visit
Admission: RE | Admit: 2019-03-03 | Discharge: 2019-03-03 | Disposition: A | Discharge status: Home / Self Care | Payer: 59 | Source: Ambulatory Visit | Attending: Nurse Practitioner | Admitting: Nurse Practitioner

## 2019-03-03 DIAGNOSIS — R928 Other abnormal and inconclusive findings on diagnostic imaging of breast: Secondary | ICD-10-CM

## 2019-03-03 DIAGNOSIS — N6489 Other specified disorders of breast: Secondary | ICD-10-CM | POA: Diagnosis not present

## 2019-03-09 NOTE — Progress Notes (Signed)
We will continue to monitor.

## 2019-03-19 ENCOUNTER — Other Ambulatory Visit: Payer: Self-pay | Admitting: Nurse Practitioner

## 2019-03-19 DIAGNOSIS — I10 Essential (primary) hypertension: Secondary | ICD-10-CM

## 2019-03-19 MED FILL — HYDROCHLOROTHIAZIDE 12.5 MG: 12.5 | 30 days supply | Qty: 30 | Fill #0

## 2019-03-25 MED FILL — AMLODIPINE BESYLATE 5 MG TA: 5 | 30 days supply | Qty: 30 | Fill #6

## 2019-04-08 ENCOUNTER — Other Ambulatory Visit: Payer: Self-pay

## 2019-04-08 ENCOUNTER — Encounter: Payer: Self-pay | Admitting: Nurse Practitioner

## 2019-04-08 ENCOUNTER — Ambulatory Visit: Payer: 59 | Admitting: Nurse Practitioner

## 2019-04-08 VITALS — BP 118/92 | HR 74 | Temp 98.1°F | Ht 70.2 in | Wt 338.2 lb

## 2019-04-08 DIAGNOSIS — Z6841 Body Mass Index (BMI) 40.0 and over, adult: Secondary | ICD-10-CM | POA: Diagnosis not present

## 2019-04-08 DIAGNOSIS — I10 Essential (primary) hypertension: Secondary | ICD-10-CM | POA: Diagnosis not present

## 2019-04-08 DIAGNOSIS — Z113 Encounter for screening for infections with a predominantly sexual mode of transmission: Secondary | ICD-10-CM

## 2019-04-08 DIAGNOSIS — R7303 Prediabetes: Secondary | ICD-10-CM | POA: Diagnosis not present

## 2019-04-08 DIAGNOSIS — E782 Mixed hyperlipidemia: Secondary | ICD-10-CM

## 2019-04-08 MED ORDER — RYBELSUS 7 MG PO TABS: 1.0000 | ORAL_TABLET | Freq: Every day | ORAL | 0 refills | Status: DC

## 2019-04-08 MED FILL — RYBELSUS 7 MG TABS: 7 | 30 days supply | Qty: 30 | Fill #0

## 2019-04-08 NOTE — Progress Notes (Signed)
Subjective:     Patient ID: Leslie Mendoza , female    DOB: [birth year] , 38 y.o.   MRN: [id number]   Chief Complaint  Patient presents with  . Hypertension    HPI  Wt Readings from Last 3 Encounters: 04/08/19 : (!) 338 lb 3.2 oz (153.4 kg) 01/05/19 : (!) 350 lb 9.6 oz (159 kg) 12/04/18 : (!) 352 lb 3.2 oz (159.8 kg)  She is living on the 3rd floor, she is moving more.    Hypertension This is a chronic problem. The current episode started more than 1 month ago. The problem is controlled. Pertinent negatives include no anxiety, chest pain, headaches or palpitations. There are no associated agents to hypertension. Risk factors for coronary artery disease include obesity, sedentary lifestyle and smoking/tobacco exposure. Past treatments include lifestyle changes. There are no compliance problems.  There is no history of angina.     Past Medical History:  Diagnosis Date  . Achilles tendinitis   . Hypertension      Family History  Problem Relation Age of Onset  . Cancer Mother   . Hypertension Mother   . Breast cancer Mother   . Heart disease Father   . Diabetes Father   . COPD Brother      Current Outpatient Medications:  .  amLODipine (NORVASC) 5 MG tablet, Take 1 tablet (5 mg total) by mouth daily., Disp: 30 tablet, Rfl: 2 .  hydrochlorothiazide (MICROZIDE) 12.5 MG capsule, TAKE 1 CAPSULE BY MOUTH ONCE DAILY, Disp: 30 capsule, Rfl: 2 .  meloxicam (MOBIC) 15 MG tablet, Take 1 tablet (15 mg total) by mouth daily., Disp: 30 tablet, Rfl: 3 .  Vitamin D, Ergocalciferol, (DRISDOL) 1.25 MG (50000 UT) CAPS capsule, Take 1 capsule (50,000 Units total) by mouth 2 (two) times a week., Disp: 24 capsule, Rfl: 1   Allergies  Allergen Reactions  . Pineapple     hives     Review of Systems  Constitutional: Negative.  Negative for fatigue.  Respiratory: Negative.   Cardiovascular: Negative.  Negative for chest pain, palpitations and leg swelling.  Endocrine: Negative for  polydipsia, polyphagia and polyuria.  Musculoskeletal: Negative.   Neurological: Negative for dizziness and headaches.     Today's Vitals   04/08/19 0856  BP: (!) 118/92  Pulse: 74  Temp: 98.1 F (36.7 C)  TempSrc: Oral  SpO2: 97%  Weight: (!) 338 lb 3.2 oz (153.4 kg)  Height: 5' 10.2" (1.783 m)   Body mass index is 48.25 kg/m.   Objective:  Physical Exam Vitals signs reviewed.  Constitutional:      Appearance: Normal appearance.  Cardiovascular:     Rate and Rhythm: Normal rate and regular rhythm.     Pulses: Normal pulses.     Heart sounds: Normal heart sounds. No murmur.  Skin:    General: Skin is warm and dry.     Capillary Refill: Capillary refill takes less than 2 seconds.  Neurological:     General: No focal deficit present.     Mental Status: She is alert and oriented to person, place, and time.  Psychiatric:        Mood and Affect: Mood normal.        Behavior: Behavior normal.        Thought Content: Thought content normal.        Judgment: Judgment normal.         Assessment And Plan:     1. Essential hypertension . Much  better controlled blood pressure and tolerating medications well  2. Prediabetes Will start rybelsus for prediabetes Advised to take 30 minutes before breakfast - Semaglutide (RYBELSUS) 7 MG TABS; Take 1 tablet by mouth daily. Take 1 tablet by mouth 30 minutes before eating breakfast  Dispense: 30 tablet; Refill: 0 - Hemoglobin A1c - BMP8+eGFR  3. Mixed hyperlipidemia  Chronic, controlled  Continue with current medications - Lipid Profile  4. Screening examination for STD (sexually transmitted disease)  - HIV antibody (with reflex) - T pallidum Screening [facility]  5. Class 3 severe obesity due to excess calories with serious comorbidity and body mass index (BMI) of 50.0 to 59.9 in adult [facility])  Chronic  Discussed healthy diet and regular exercise options   Increase exercise at least 150 minutes per week with 2 days of  strength training     Minette Brine, FNP    THE PATIENT IS ENCOURAGED TO PRACTICE SOCIAL DISTANCING DUE TO THE COVID-19 PANDEMIC.

## 2019-04-08 NOTE — Patient Instructions (Signed)
Great job with your weight loss

## 2019-04-09 LAB — LIPID PANEL
Chol/HDL Ratio: 4.3 ratio (ref 0.0–4.4)
Cholesterol, Total: 214 mg/dL — ABNORMAL HIGH (ref 100–199)
HDL: 50 mg/dL (ref >39)
LDL Calculated: 140 mg/dL — ABNORMAL HIGH (ref 0–99)
Triglycerides: 121 mg/dL (ref 0–149)
VLDL Cholesterol Cal: 24 mg/dL (ref 5–40)

## 2019-04-09 LAB — BMP8+EGFR
BUN/Creatinine Ratio: 16 (ref 9–23)
BUN: 15 mg/dL (ref 6–20)
CO2: 23 mmol/L (ref 20–29)
Calcium: 9.7 mg/dL (ref 8.7–10.2)
Chloride: 100 mmol/L (ref 96–106)
Creatinine, Ser: 0.92 mg/dL (ref 0.57–1.00)
GFR calc Af Amer: 91 mL/min/{1.73_m2} (ref >59)
GFR calc non Af Amer: 79 mL/min/{1.73_m2} (ref >59)
Glucose: 86 mg/dL (ref 65–99)
Potassium: 4.2 mmol/L (ref 3.5–5.2)
Sodium: 139 mmol/L (ref 134–144)

## 2019-04-09 LAB — T PALLIDUM SCREENING CASCADE: T pallidum Antibodies (TP-PA): NONREACTIVE

## 2019-04-09 LAB — HEMOGLOBIN A1C
Est. average glucose Bld gHb Est-mCnc: 134 mg/dL
Hgb A1c MFr Bld: 6.3 % — ABNORMAL HIGH (ref 4.8–5.6)

## 2019-04-09 LAB — HIV ANTIBODY (ROUTINE TESTING W REFLEX): HIV Screen 4th Generation wRfx: NONREACTIVE

## 2019-04-13 ENCOUNTER — Other Ambulatory Visit: Payer: Self-pay | Admitting: Nurse Practitioner

## 2019-04-13 ENCOUNTER — Encounter: Payer: Self-pay | Admitting: Nurse Practitioner

## 2019-04-13 DIAGNOSIS — E782 Mixed hyperlipidemia: Secondary | ICD-10-CM

## 2019-04-13 DIAGNOSIS — R7303 Prediabetes: Secondary | ICD-10-CM

## 2019-04-13 MED ORDER — ATORVASTATIN CALCIUM 10 MG PO TABS: 10.0000 mg | ORAL_TABLET | Freq: Every day | ORAL | 2 refills | Status: DC

## 2019-04-13 MED FILL — ATORVASTATIN 10 MG TABLET: 10 | 30 days supply | Qty: 30 | Fill #0

## 2019-04-13 MED FILL — HYDROCHLOROTHIAZIDE 12.5 MG: 12.5 | 30 days supply | Qty: 30 | Fill #1

## 2019-04-22 ENCOUNTER — Encounter: Payer: Self-pay | Admitting: Nurse Practitioner

## 2019-04-24 ENCOUNTER — Encounter: Payer: Self-pay | Admitting: Podiatry

## 2019-04-24 ENCOUNTER — Ambulatory Visit: Payer: 59 | Admitting: Podiatry

## 2019-04-24 ENCOUNTER — Other Ambulatory Visit: Payer: Self-pay

## 2019-04-24 DIAGNOSIS — M7661 Achilles tendinitis, right leg: Secondary | ICD-10-CM | POA: Diagnosis not present

## 2019-04-24 DIAGNOSIS — M7662 Achilles tendinitis, left leg: Secondary | ICD-10-CM

## 2019-04-24 MED ORDER — MELOXICAM 15 MG PO TABS: 15.0000 mg | ORAL_TABLET | Freq: Every day | ORAL | 3 refills | Status: DC

## 2019-04-24 MED ORDER — METHYLPREDNISOLONE 4 MG PO TBPK: ORAL_TABLET | ORAL | 0 refills | Status: DC

## 2019-04-24 MED FILL — METHYLPREDNISOLONE 4 MG TBP: 4 | 6 days supply | Qty: 21 | Fill #0

## 2019-04-24 MED FILL — MELOXICAM 15 MG TABLET: 15 | 30 days supply | Qty: 30 | Fill #0

## 2019-04-24 MED FILL — AMLODIPINE BESYLATE 5 MG TA: 5 | 30 days supply | Qty: 30 | Fill #7

## 2019-04-26 NOTE — Progress Notes (Signed)
   HPI: female 38 y.o. presenting today for follow up evaluation of insertional Achilles tendinitis of the left lower extremity. She states she is now experiencing pain in the bilateral heels and Achilles regions. She was taking Meloxicam for treatment which was helping but is now out. Being on her feet for long periods of time increases the pain. Patient is here for further evaluation and treatment.   Past Medical History:  Diagnosis Date  . Achilles tendinitis   . Hypertension       Physical Exam: General: The patient is alert and oriented x3 in no acute distress.  Dermatology: Skin is warm, dry and supple bilateral lower extremities. Negative for open lesions or macerations.  Vascular: Palpable pedal pulses bilaterally. No edema or erythema noted. Capillary refill within normal limits.  Neurological: Epicritic and protective threshold grossly intact bilaterally.   Musculoskeletal Exam: Pain on palpation noted to the posterior tubercle of the bilateral calcaneus at the insertion of the Achilles tendon consistent with retrocalcaneal bursitis. Range of motion within normal limits. Muscle strength 5/5 in all muscle groups bilateral lower extremities.  Radiographic Exam:  Posterior calcaneal spur noted to the respective calcaneus on lateral view. No fracture or dislocation noted. Normal osseous mineralization noted.     Assessment: 1. Insertional Achilles tendinitis bilateral  2. Retrocalcaneal bursitis   Plan of Care:  1. Patient was evaluated. Radiographs were reviewed today. 2. Injection of 0.5 mL Celestone Soluspan injected into the retrocalcaneal bursa bilaterally. Care was taken to avoid direct injection into the Achilles tendon. 3. Refill prescription for Meloxicam provided to patient.  4. Prescription for Medrol Dose Pak provided to patient. 5. Appointment with Liliane Channel, Pedorthist, for custom molded orthotics.  6. Return to clinic as needed.    Edrick Kins, DPM [facility]  Dr. Edrick Kins, [address]                                        [place], [place] [address]                Office [phone]  Fax [phone]

## 2019-05-02 ENCOUNTER — Other Ambulatory Visit: Payer: Self-pay | Admitting: Nurse Practitioner

## 2019-05-02 DIAGNOSIS — R7303 Prediabetes: Secondary | ICD-10-CM

## 2019-05-05 MED FILL — RYBELSUS 7 MG TABS: 7 | 30 days supply | Qty: 30 | Fill #0

## 2019-05-07 MED FILL — ATORVASTATIN 10 MG TABLET: 10 | 60 days supply | Qty: 60 | Fill #1

## 2019-05-12 MED FILL — HYDROCHLOROTHIAZIDE 12.5 MG: 12.5 | 30 days supply | Qty: 30 | Fill #2

## 2019-05-13 ENCOUNTER — Ambulatory Visit (INDEPENDENT_AMBULATORY_CARE_PROVIDER_SITE_OTHER): Payer: 59 | Admitting: Orthotics

## 2019-05-13 ENCOUNTER — Other Ambulatory Visit: Payer: Self-pay

## 2019-05-13 DIAGNOSIS — M722 Plantar fascial fibromatosis: Secondary | ICD-10-CM

## 2019-05-13 DIAGNOSIS — M7661 Achilles tendinitis, right leg: Secondary | ICD-10-CM

## 2019-05-13 DIAGNOSIS — M7662 Achilles tendinitis, left leg: Secondary | ICD-10-CM

## 2019-05-13 NOTE — Progress Notes (Signed)
Patient came into today to be cast for Custom Foot Orthotics. Upon recommendation of Dr. Amalia Hailey Patient presents with achilles tendon Goals are take tension/ pressure off post foot/ankle Plan vendor

## 2019-05-27 MED FILL — AMLODIPINE BESYLATE 5 MG TA: 5 | 30 days supply | Qty: 30 | Fill #8

## 2019-05-29 ENCOUNTER — Other Ambulatory Visit: Payer: Self-pay | Admitting: Nurse Practitioner

## 2019-05-29 DIAGNOSIS — R7303 Prediabetes: Secondary | ICD-10-CM

## 2019-06-01 MED FILL — RYBELSUS 7 MG TABS: 7 | 30 days supply | Qty: 30 | Fill #0

## 2019-06-01 MED FILL — MELOXICAM 15 MG TABLET: 15 | 30 days supply | Qty: 30 | Fill #1

## 2019-06-11 ENCOUNTER — Other Ambulatory Visit: Payer: Self-pay | Admitting: Nurse Practitioner

## 2019-06-11 DIAGNOSIS — I10 Essential (primary) hypertension: Secondary | ICD-10-CM

## 2019-06-11 MED FILL — HYDROCHLOROTHIAZIDE 12.5 MG: 12.5 | 90 days supply | Qty: 90 | Fill #0

## 2019-06-17 ENCOUNTER — Other Ambulatory Visit: Payer: Self-pay

## 2019-06-17 ENCOUNTER — Ambulatory Visit: Payer: 59 | Admitting: Orthotics

## 2019-06-17 DIAGNOSIS — M7661 Achilles tendinitis, right leg: Secondary | ICD-10-CM

## 2019-06-17 DIAGNOSIS — M7662 Achilles tendinitis, left leg: Secondary | ICD-10-CM

## 2019-06-17 DIAGNOSIS — M722 Plantar fascial fibromatosis: Secondary | ICD-10-CM

## 2019-06-17 NOTE — Progress Notes (Signed)
Patient came in today to pick up custom made foot orthotics.  The goals were accomplished and the patient reported no dissatisfaction with said orthotics.  Patient was advised of breakin period and how to report any issues. 

## 2019-06-25 ENCOUNTER — Other Ambulatory Visit: Payer: Self-pay | Admitting: Nurse Practitioner

## 2019-06-25 DIAGNOSIS — R7303 Prediabetes: Secondary | ICD-10-CM

## 2019-06-25 MED FILL — RYBELSUS 7 MG TABS: 7 | 30 days supply | Qty: 30 | Fill #0

## 2019-06-26 MED FILL — AMLODIPINE BESYLATE 5 MG TA: 5 | 30 days supply | Qty: 30 | Fill #9

## 2019-07-03 MED FILL — OMRON 3 SERIES BP MONITOR D: 1 days supply | Qty: 1 | Fill #0

## 2019-07-03 MED FILL — MELOXICAM 15 MG TABLET: 15 | 30 days supply | Qty: 30 | Fill #2

## 2019-07-07 ENCOUNTER — Other Ambulatory Visit: Payer: Self-pay | Admitting: Nurse Practitioner

## 2019-07-07 DIAGNOSIS — R7303 Prediabetes: Secondary | ICD-10-CM

## 2019-07-07 DIAGNOSIS — E782 Mixed hyperlipidemia: Secondary | ICD-10-CM

## 2019-07-07 MED FILL — ATORVASTATIN 10 MG TABLET: 10 | 60 days supply | Qty: 60 | Fill #0

## 2019-07-09 ENCOUNTER — Ambulatory Visit: Payer: 59 | Admitting: Nurse Practitioner

## 2019-07-09 ENCOUNTER — Encounter: Payer: Self-pay | Admitting: Nurse Practitioner

## 2019-07-09 ENCOUNTER — Other Ambulatory Visit: Payer: Self-pay

## 2019-07-09 VITALS — BP 120/72 | HR 85 | Temp 98.8°F | Ht 70.4 in | Wt 336.6 lb

## 2019-07-09 DIAGNOSIS — Z716 Tobacco abuse counseling: Secondary | ICD-10-CM

## 2019-07-09 DIAGNOSIS — E782 Mixed hyperlipidemia: Secondary | ICD-10-CM

## 2019-07-09 DIAGNOSIS — I10 Essential (primary) hypertension: Secondary | ICD-10-CM

## 2019-07-09 DIAGNOSIS — R7303 Prediabetes: Secondary | ICD-10-CM | POA: Diagnosis not present

## 2019-07-09 DIAGNOSIS — Z720 Tobacco use: Secondary | ICD-10-CM

## 2019-07-09 DIAGNOSIS — Z6841 Body Mass Index (BMI) 40.0 and over, adult: Secondary | ICD-10-CM

## 2019-07-09 LAB — CMP14+EGFR
ALT: 13 IU/L (ref 0–32)
AST: 17 IU/L (ref 0–40)
Albumin/Globulin Ratio: 1.3 (ref 1.2–2.2)
Albumin: 4.2 g/dL (ref 3.8–4.8)
Alkaline Phosphatase: 130 IU/L — ABNORMAL HIGH (ref 39–117)
BUN/Creatinine Ratio: 20 (ref 9–23)
BUN: 19 mg/dL (ref 6–20)
Bilirubin Total: <0.2 mg/dL (ref 0.0–1.2)
CO2: 24 mmol/L (ref 20–29)
Calcium: 9.5 mg/dL (ref 8.7–10.2)
Chloride: 103 mmol/L (ref 96–106)
Creatinine, Ser: 0.94 mg/dL (ref 0.57–1.00)
GFR calc Af Amer: 88 mL/min/{1.73_m2} (ref >59)
GFR calc non Af Amer: 77 mL/min/{1.73_m2} (ref >59)
Globulin, Total: 3.2 g/dL (ref 1.5–4.5)
Glucose: 68 mg/dL (ref 65–99)
Potassium: 3.7 mmol/L (ref 3.5–5.2)
Sodium: 141 mmol/L (ref 134–144)
Total Protein: 7.4 g/dL (ref 6.0–8.5)

## 2019-07-09 LAB — LIPID PANEL
Chol/HDL Ratio: 3.7 ratio (ref 0.0–4.4)
Cholesterol, Total: 201 mg/dL — ABNORMAL HIGH (ref 100–199)
HDL: 55 mg/dL (ref >39)
LDL Chol Calc (NIH): 131 mg/dL — ABNORMAL HIGH (ref 0–99)
Triglycerides: 82 mg/dL (ref 0–149)
VLDL Cholesterol Cal: 15 mg/dL (ref 5–40)

## 2019-07-09 LAB — HEMOGLOBIN A1C
Est. average glucose Bld gHb Est-mCnc: 131 mg/dL
Hgb A1c MFr Bld: 6.2 % — ABNORMAL HIGH (ref 4.8–5.6)

## 2019-07-09 MED ORDER — RYBELSUS 14 MG PO TABS: 1.0000 | ORAL_TABLET | Freq: Every day | ORAL | 5 refills | Status: DC

## 2019-07-09 MED FILL — RYBELSUS 14 MG TABS: 14 | 30 days supply | Qty: 30 | Fill #0

## 2019-07-09 NOTE — Progress Notes (Addendum)
Subjective:     Patient ID: Leslie Mendoza , female    DOB: [birth year] , 38 y.o.   MRN: [id number]   Chief Complaint  Patient presents with  . Hypertension  . prediabetes    HPI  Wt Readings from Last 3 Encounters: 07/09/19 : (!) 336 lb 9.6 oz (152.7 kg) 04/08/19 : (!) 338 lb 3.2 oz (153.4 kg) 01/05/19 : (!) 350 lb 9.6 oz (159 kg)  Still trying to exercise.  She feels she is getting full more quickly.    She continues to have achilles tendonitis now in the right foot.  Unable to exercise fully due to the pain and discomfort.   Hypertension This is a chronic problem. The current episode started more than 1 month ago. The problem is controlled. Pertinent negatives include no anxiety, chest pain, headaches or palpitations. There are no associated agents to hypertension. Risk factors for coronary artery disease include obesity, sedentary lifestyle and smoking/tobacco exposure. Past treatments include lifestyle changes and diuretics. There are no compliance problems.  There is no history of angina. There is no history of chronic renal disease.     Past Medical History:  Diagnosis Date  . Achilles tendinitis   . Hypertension      Family History  Problem Relation Age of Onset  . Cancer Mother   . Hypertension Mother   . Breast cancer Mother   . Heart disease Father   . Diabetes Father   . COPD Brother      Current Outpatient Medications:  .  amLODipine (NORVASC) 5 MG tablet, Take 1 tablet (5 mg total) by mouth daily., Disp: 30 tablet, Rfl: 2 .  hydrochlorothiazide (MICROZIDE) 12.5 MG capsule, TAKE 1 CAPSULE BY MOUTH ONCE DAILY, Disp: 30 capsule, Rfl: 2 .  meloxicam (MOBIC) 15 MG tablet, Take 1 tablet (15 mg total) by mouth daily., Disp: 30 tablet, Rfl: 3 .  RYBELSUS 7 MG TABS, TAKE 1 TABLET BY MOUTH DAILY 30 MINUTES BEFORE EATING BREAKFAST, Disp: 30 tablet, Rfl: 0 .  atorvastatin (LIPITOR) 10 MG tablet, TAKE 1 TABLET BY MOUTH ONCE DAILY (Patient not taking: Reported on  07/09/2019), Disp: 60 tablet, Rfl: 2 .  Vitamin D, Ergocalciferol, (DRISDOL) 1.25 MG (50000 UT) CAPS capsule, Take 1 capsule (50,000 Units total) by mouth 2 (two) times a week., Disp: 24 capsule, Rfl: 1   Allergies  Allergen Reactions  . Pineapple     hives     Review of Systems  Constitutional: Negative.  Negative for fatigue.  Respiratory: Negative.   Cardiovascular: Negative.  Negative for chest pain, palpitations and leg swelling.  Endocrine: Negative for polydipsia, polyphagia and polyuria.  Musculoskeletal: Negative.   Neurological: Negative for dizziness and headaches.     Today's Vitals   07/09/19 0838  BP: 120/72  Pulse: 85  Temp: 98.8 F (37.1 C)  TempSrc: Oral  Weight: (!) 336 lb 9.6 oz (152.7 kg)  Height: 5' 10.4" (1.788 m)  PainSc: 5   PainLoc: Ankle   Body mass index is 47.75 kg/m.   Objective:  Physical Exam Vitals signs reviewed.  Constitutional:      Appearance: Normal appearance.  Cardiovascular:     Rate and Rhythm: Normal rate and regular rhythm.     Pulses: Normal pulses.     Heart sounds: Normal heart sounds. No murmur.  Skin:    General: Skin is warm and dry.     Capillary Refill: Capillary refill takes less than 2 seconds.  Neurological:  General: No focal deficit present.     Mental Status: She is alert and oriented to person, place, and time.  Psychiatric:        Mood and Affect: Mood normal.        Behavior: Behavior normal.        Thought Content: Thought content normal.        Judgment: Judgment normal.         Assessment And Plan:     1. Essential hypertension . Chronic, continues to be well controlled. - CMP14+EGFR  2. Prediabetes  Continue Rybelsus increasing to 28m   Can take a probiotic if have upset stomach  3. Mixed hyperlipidemia  Chronic, controlled  Continue with current medications - Lipid Profile  4. Morbid obesity with BMI of 45.0-49.9, adult (HCC)  Chronic  Discussed healthy diet and regular  exercise options   Increase exercise at least 150 minutes per week with 2 days of strength training  5. Tobacco abuse  Chronic, she is down to less than 1 pack per day  6. Tobacco abuse counseling  Ready to quit: No  Counseling given: Yes  Smoking cessation instruction/counseling given:  counseled patient on the dangers of tobacco use, advised patient to stop smoking, and reviewed strategies to maximize success      JMinette Brine FNP    THE PATIENT IS ENCOURAGED TO PRACTICE SOCIAL DISTANCING DUE TO THE COVID-19 PANDEMIC.

## 2019-07-20 ENCOUNTER — Other Ambulatory Visit: Payer: Self-pay | Admitting: Nurse Practitioner

## 2019-07-20 DIAGNOSIS — R7303 Prediabetes: Secondary | ICD-10-CM

## 2019-07-28 MED FILL — AMLODIPINE BESYLATE 5 MG TA: 5 | 30 days supply | Qty: 30 | Fill #10

## 2019-08-03 MED FILL — MELOXICAM 15 MG TABLET: 15 | 30 days supply | Qty: 30 | Fill #3

## 2019-08-03 MED FILL — RYBELSUS 14 MG TABS: 14 | 30 days supply | Qty: 30 | Fill #1

## 2019-08-28 MED FILL — AMLODIPINE BESYLATE 5 MG TA: 5 | 30 days supply | Qty: 30 | Fill #11

## 2019-09-01 MED FILL — RYBELSUS 14 MG TABS: 14 | 30 days supply | Qty: 30 | Fill #2

## 2019-09-03 ENCOUNTER — Other Ambulatory Visit: Payer: Self-pay | Admitting: Nurse Practitioner

## 2019-09-03 DIAGNOSIS — I10 Essential (primary) hypertension: Secondary | ICD-10-CM

## 2019-09-04 MED FILL — HYDROCHLOROTHIAZIDE 12.5 MG: 12.5 | 90 days supply | Qty: 90 | Fill #0

## 2019-09-15 ENCOUNTER — Other Ambulatory Visit: Payer: Self-pay | Admitting: Podiatry

## 2019-09-15 MED FILL — MELOXICAM 15 MG TABLET: 15 | 30 days supply | Qty: 30 | Fill #0

## 2019-09-29 MED FILL — AMLODIPINE BESYLATE 5 MG TA: 5 | 30 days supply | Qty: 30 | Fill #0

## 2019-10-01 MED FILL — RYBELSUS 14 MG TABS: 14 | 30 days supply | Qty: 30 | Fill #3

## 2019-10-09 MED FILL — MELOXICAM 15 MG TABLET: 15 | 30 days supply | Qty: 30 | Fill #1

## 2019-10-29 MED FILL — ATORVASTATIN 10 MG TABLET: 10 | 60 days supply | Qty: 60 | Fill #2

## 2019-10-31 MED FILL — RYBELSUS 14 MG TABS: 14 | 30 days supply | Qty: 30 | Fill #4

## 2019-11-09 MED FILL — MELOXICAM 15 MG TABLET: 15 | 30 days supply | Qty: 30 | Fill #2

## 2019-11-12 ENCOUNTER — Encounter: Payer: Self-pay | Admitting: Nurse Practitioner

## 2019-11-12 ENCOUNTER — Other Ambulatory Visit: Payer: Self-pay

## 2019-11-12 ENCOUNTER — Ambulatory Visit: Payer: 59 | Admitting: Nurse Practitioner

## 2019-11-12 VITALS — BP 126/80 | HR 78 | Temp 98.2°F | Ht 70.4 in | Wt 329.8 lb

## 2019-11-12 DIAGNOSIS — R7303 Prediabetes: Secondary | ICD-10-CM

## 2019-11-12 DIAGNOSIS — E782 Mixed hyperlipidemia: Secondary | ICD-10-CM | POA: Diagnosis not present

## 2019-11-12 DIAGNOSIS — Z6841 Body Mass Index (BMI) 40.0 and over, adult: Secondary | ICD-10-CM

## 2019-11-12 DIAGNOSIS — I10 Essential (primary) hypertension: Secondary | ICD-10-CM

## 2019-11-12 MED ORDER — OZEMPIC (0.25 OR 0.5 MG/DOSE) 2 MG/1.5ML ~~LOC~~ SOPN: 0.5000 mg | PEN_INJECTOR | SUBCUTANEOUS | 1 refills | Status: DC

## 2019-11-12 NOTE — Patient Instructions (Signed)
   Encouraged to increase physical activity slowly start by walking consistently an extra 5-10 minutes a day with a goal of 30 minutes each time

## 2019-11-12 NOTE — Progress Notes (Signed)
Subjective:     Patient ID: Leslie Mendoza , female    DOB: 13-Jan-1981 , 39 y.o.   MRN: [id number]   Chief Complaint  Patient presents with  . Hypertension  . prediabetes    HPI  Wt Readings from Last 3 Encounters: 11/12/19 : (!) 329 lb 12.8 oz (149.6 kg) 07/09/19 : (!) 336 lb 9.6 oz (152.7 kg) 04/08/19 : (!) 338 lb 3.2 oz (153.4 kg)    Hypertension This is a chronic problem. The current episode started more than 1 month ago. The problem is unchanged. The problem is controlled. Pertinent negatives include no anxiety, chest pain, headaches or palpitations. There are no associated agents to hypertension. Risk factors for coronary artery disease include obesity, sedentary lifestyle and smoking/tobacco exposure. Past treatments include lifestyle changes and diuretics. There are no compliance problems.  There is no history of angina. There is no history of chronic renal disease.     Past Medical History:  Diagnosis Date  . Achilles tendinitis   . Hypertension      Family History  Problem Relation Age of Onset  . Cancer Mother   . Hypertension Mother   . Breast cancer Mother   . Heart disease Father   . Diabetes Father   . COPD Brother      Current Outpatient Medications:  .  amLODipine (NORVASC) 5 MG tablet, Take 1 tablet (5 mg total) by mouth daily., Disp: 30 tablet, Rfl: 2 .  atorvastatin (LIPITOR) 10 MG tablet, TAKE 1 TABLET BY MOUTH ONCE DAILY, Disp: 60 tablet, Rfl: 2 .  hydrochlorothiazide (MICROZIDE) 12.5 MG capsule, TAKE 1 CAPSULE BY MOUTH ONCE DAILY, Disp: 90 capsule, Rfl: 1 .  meloxicam (MOBIC) 15 MG tablet, TAKE 1 TABLET BY MOUTH ONCE A DAY, Disp: 30 tablet, Rfl: 3 .  Semaglutide (RYBELSUS) 14 MG TABS, Take 1 tablet by mouth daily. Take 30 minutes before breakfast, Disp: 30 tablet, Rfl: 5   Allergies  Allergen Reactions  . Pineapple     hives     Review of Systems  Constitutional: Negative.  Negative for fatigue.  Respiratory: Negative.  Negative for  cough.   Cardiovascular: Negative.  Negative for chest pain, palpitations and leg swelling.  Endocrine: Negative for polydipsia, polyphagia and polyuria.  Musculoskeletal: Negative.   Neurological: Negative for dizziness and headaches.  Psychiatric/Behavioral: Negative.      Today's Vitals   11/12/19 0839  BP: 126/80  Pulse: 78  Temp: 98.2 F (36.8 C)  TempSrc: Oral  Weight: (!) 329 lb 12.8 oz (149.6 kg)  Height: 5' 10.4" (1.788 m)  PainSc: 0-No pain   Body mass index is 46.79 kg/m.   Objective:  Physical Exam Vitals reviewed.  Constitutional:      Appearance: Normal appearance.  Cardiovascular:     Rate and Rhythm: Normal rate and regular rhythm.     Pulses: Normal pulses.     Heart sounds: Normal heart sounds. No murmur.  Skin:    General: Skin is warm and dry.     Capillary Refill: Capillary refill takes less than 2 seconds.  Neurological:     General: No focal deficit present.     Mental Status: She is alert and oriented to person, place, and time.  Psychiatric:        Mood and Affect: Mood normal.        Behavior: Behavior normal.        Thought Content: Thought content normal.  Judgment: Judgment normal.         Assessment And Plan:     1. Essential hypertension . Chronic, continues to be well controlled. - CMP14+EGFR  2. Prediabetes  She is unable to tolerate the Rybelsus due to nausea  Will change to Ozempic to see if helps with nausea, she will try the sample.   She will let us know how she is doing before sending to the pharmacy. - Semaglutide,0.25 or 0.5MG/DOS, (OZEMPIC, 0.25 OR 0.5 MG/DOSE,) 2 MG/1.5ML SOPN; Inject 0.5 mg into the skin once a week.  Dispense: 9 pen; Refill: 1 - Hemoglobin A1c  3. Mixed hyperlipidemia  Chronic, improved at last visit - Lipid panel  4. Morbid obesity with BMI of 45.0-49.9, adult ([facility])  Chronic   She has had a 7lb weight loss since her last visit.   Encouraged to increase physical activity slowly  start by walking consistently an extra 5-10 minutes a day with a goal of 30 minutes each time          Minette Brine, FNP    THE PATIENT IS ENCOURAGED TO PRACTICE SOCIAL DISTANCING DUE TO THE COVID-19 PANDEMIC.

## 2019-11-13 LAB — LIPID PANEL
Chol/HDL Ratio: 2.8 ratio (ref 0.0–4.4)
Cholesterol, Total: 136 mg/dL (ref 100–199)
HDL: 48 mg/dL (ref >39)
LDL Chol Calc (NIH): 71 mg/dL (ref 0–99)
Triglycerides: 87 mg/dL (ref 0–149)
VLDL Cholesterol Cal: 17 mg/dL (ref 5–40)

## 2019-11-13 LAB — CMP14+EGFR
ALT: 16 IU/L (ref 0–32)
AST: 13 IU/L (ref 0–40)
Albumin/Globulin Ratio: 1.3 (ref 1.2–2.2)
Albumin: 4.2 g/dL (ref 3.8–4.8)
Alkaline Phosphatase: 154 IU/L — ABNORMAL HIGH (ref 39–117)
BUN/Creatinine Ratio: 15 (ref 9–23)
BUN: 15 mg/dL (ref 6–20)
Bilirubin Total: 0.2 mg/dL (ref 0.0–1.2)
CO2: 26 mmol/L (ref 20–29)
Calcium: 9.7 mg/dL (ref 8.7–10.2)
Chloride: 100 mmol/L (ref 96–106)
Creatinine, Ser: 0.98 mg/dL (ref 0.57–1.00)
GFR calc Af Amer: 84 mL/min/{1.73_m2} (ref >59)
GFR calc non Af Amer: 73 mL/min/{1.73_m2} (ref >59)
Globulin, Total: 3.3 g/dL (ref 1.5–4.5)
Glucose: 77 mg/dL (ref 65–99)
Potassium: 4 mmol/L (ref 3.5–5.2)
Sodium: 138 mmol/L (ref 134–144)
Total Protein: 7.5 g/dL (ref 6.0–8.5)

## 2019-11-13 LAB — HEMOGLOBIN A1C
Est. average glucose Bld gHb Est-mCnc: 126 mg/dL
Hgb A1c MFr Bld: 6 % — ABNORMAL HIGH (ref 4.8–5.6)

## 2019-11-27 MED FILL — HYDROCHLOROTHIAZIDE 12.5 MG: 12.5 | 90 days supply | Qty: 90 | Fill #1

## 2019-11-30 ENCOUNTER — Other Ambulatory Visit: Payer: Self-pay | Admitting: Nurse Practitioner

## 2019-11-30 DIAGNOSIS — I10 Essential (primary) hypertension: Secondary | ICD-10-CM

## 2019-11-30 MED FILL — AMLODIPINE BESYLATE 5 MG TA: 5 | 30 days supply | Qty: 30 | Fill #0

## 2019-12-08 ENCOUNTER — Encounter: Payer: Self-pay | Admitting: Nurse Practitioner

## 2019-12-08 ENCOUNTER — Other Ambulatory Visit: Payer: Self-pay

## 2019-12-08 DIAGNOSIS — R7303 Prediabetes: Secondary | ICD-10-CM

## 2019-12-08 MED ORDER — OZEMPIC (0.25 OR 0.5 MG/DOSE) 2 MG/1.5ML ~~LOC~~ SOPN: 0.5000 mg | PEN_INJECTOR | SUBCUTANEOUS | 1 refills | Status: DC

## 2019-12-08 MED FILL — MELOXICAM 15 MG TABLET: 15 | 30 days supply | Qty: 30 | Fill #3

## 2019-12-21 ENCOUNTER — Encounter: Payer: Self-pay | Admitting: Nurse Practitioner

## 2019-12-21 ENCOUNTER — Other Ambulatory Visit: Payer: Self-pay

## 2019-12-21 DIAGNOSIS — R7303 Prediabetes: Secondary | ICD-10-CM

## 2019-12-21 MED ORDER — OZEMPIC (0.25 OR 0.5 MG/DOSE) 2 MG/1.5ML ~~LOC~~ SOPN: 0.5000 mg | PEN_INJECTOR | SUBCUTANEOUS | 1 refills | Status: DC

## 2019-12-21 MED FILL — OZEMPIC 0.25 OR 0.5 MG/DOSE: 2 | 84 days supply | Qty: 5 | Fill #0

## 2019-12-25 MED FILL — AMLODIPINE BESYLATE 5 MG TA: 5 | 30 days supply | Qty: 30 | Fill #1

## 2019-12-28 ENCOUNTER — Other Ambulatory Visit: Payer: Self-pay | Admitting: Nurse Practitioner

## 2019-12-28 DIAGNOSIS — R7303 Prediabetes: Secondary | ICD-10-CM

## 2019-12-28 DIAGNOSIS — E782 Mixed hyperlipidemia: Secondary | ICD-10-CM

## 2019-12-28 MED FILL — ATORVASTATIN CALCIUM 10 MG: 10 | 60 days supply | Qty: 60 | Fill #0

## 2020-01-06 ENCOUNTER — Ambulatory Visit (INDEPENDENT_AMBULATORY_CARE_PROVIDER_SITE_OTHER): Payer: 59 | Admitting: Nurse Practitioner

## 2020-01-06 ENCOUNTER — Encounter: Payer: Self-pay | Admitting: Nurse Practitioner

## 2020-01-06 ENCOUNTER — Other Ambulatory Visit: Payer: Self-pay

## 2020-01-06 VITALS — BP 132/80 | HR 76 | Temp 98.2°F | Ht 70.4 in | Wt 334.2 lb

## 2020-01-06 DIAGNOSIS — I10 Essential (primary) hypertension: Secondary | ICD-10-CM

## 2020-01-06 DIAGNOSIS — Z716 Tobacco abuse counseling: Secondary | ICD-10-CM | POA: Diagnosis not present

## 2020-01-06 DIAGNOSIS — R7303 Prediabetes: Secondary | ICD-10-CM

## 2020-01-06 DIAGNOSIS — Z1231 Encounter for screening mammogram for malignant neoplasm of breast: Secondary | ICD-10-CM | POA: Diagnosis not present

## 2020-01-06 DIAGNOSIS — Z6841 Body Mass Index (BMI) 40.0 and over, adult: Secondary | ICD-10-CM | POA: Diagnosis not present

## 2020-01-06 DIAGNOSIS — Z0000 Encounter for general adult medical examination without abnormal findings: Secondary | ICD-10-CM

## 2020-01-06 LAB — POCT URINALYSIS DIPSTICK
Bilirubin, UA: NEGATIVE
Blood, UA: NEGATIVE
Glucose, UA: NEGATIVE
Ketones, UA: NEGATIVE
Nitrite, UA: NEGATIVE
Protein, UA: NEGATIVE
Spec Grav, UA: >=1.03 — AB (ref 1.010–1.025)
Urobilinogen, UA: 0.2 E.U./dL
pH, UA: 5.5 (ref 5.0–8.0)

## 2020-01-06 LAB — POCT UA - MICROALBUMIN
Albumin/Creatinine Ratio, Urine, POC: 30
Creatinine, POC: 300 mg/dL
Microalbumin Ur, POC: 30 mg/L

## 2020-01-06 MED ORDER — CHANTIX STARTING MONTH PAK 0.5 MG X 11 & 1 MG X 42 PO TABS: ORAL_TABLET | ORAL | 0 refills | Status: DC

## 2020-01-06 NOTE — Progress Notes (Signed)
This visit occurred during the SARS-CoV-2 public health emergency.  Safety protocols were in place, including screening questions prior to the visit, additional usage of staff PPE, and extensive cleaning of exam room while observing appropriate contact time as indicated for disinfecting solutions.  Subjective:     Patient ID: Leslie Mendoza , female    DOB: [birth year] , 39 y.o.   MRN: [id number]   Chief Complaint  Patient presents with  . Annual Exam    HPI  Here for HM   Wt Readings from Last 3 Encounters: 01/06/20 : (!) 334 lb 3.2 oz (151.6 kg) 11/12/19 : (!) 329 lb 12.8 oz (149.6 kg) 07/09/19 : (!) 336 lb 9.6 oz (152.7 kg)     The patient states she uses none for birth control.  Negative for Dysmenorrhea and Negative for Menorrhagia Mammogram not done. Negative for: breast discharge, breast lump(s), breast pain and breast self exam.  Pertinent negatives include abnormal bleeding (hematology), anxiety, decreased libido, depression, difficulty falling sleep, dyspareunia, history of infertility, nocturia, sexual dysfunction, sleep disturbances, urinary incontinence, urinary urgency, vaginal discharge and vaginal itching. Diet regular. The patient states her exercise level is none.    The patient's tobacco use is:  Social History   Tobacco Use  Smoking Status Current Every Day Smoker  . Packs/day: 0.50  . Types: Cigarettes  Smokeless Tobacco Never Used   She has been exposed to passive smoke. The patient's alcohol use is:  Social History   Substance and Sexual Activity  Alcohol Use No   Additional information: Last pap 2020 , next one scheduled for 2023.   Past Medical History:  Diagnosis Date  . Achilles tendinitis   . Hypertension      Family History  Problem Relation Age of Onset  . Cancer Mother   . Hypertension Mother   . Breast cancer Mother   . Heart disease Father   . Diabetes Father   . COPD Brother      Current Outpatient Medications:  .   amLODipine (NORVASC) 5 MG tablet, TAKE 1 TABLET BY MOUTH ONCE A DAY, Disp: 30 tablet, Rfl: 2 .  atorvastatin (LIPITOR) 10 MG tablet, TAKE 1 TABLET BY MOUTH ONCE DAILY, Disp: 60 tablet, Rfl: 2 .  hydrochlorothiazide (MICROZIDE) 12.5 MG capsule, TAKE 1 CAPSULE BY MOUTH ONCE DAILY, Disp: 90 capsule, Rfl: 1 .  meloxicam (MOBIC) 15 MG tablet, TAKE 1 TABLET BY MOUTH ONCE A DAY, Disp: 30 tablet, Rfl: 3 .  Semaglutide,0.25 or 0.5MG /DOS, (OZEMPIC, 0.25 OR 0.5 MG/DOSE,) 2 MG/1.5ML SOPN, Inject 0.5 mg into the skin once a week., Disp: 3 mL, Rfl: 1   Allergies  Allergen Reactions  . Pineapple     hives     Review of Systems  Constitutional: Negative.  Negative for fatigue.  HENT: Negative.   Eyes: Negative.   Respiratory: Negative.   Cardiovascular: Negative.  Negative for chest pain, palpitations and leg swelling.  Gastrointestinal: Negative.   Endocrine: Negative.   Genitourinary: Negative.   Musculoskeletal: Negative.   Skin: Negative.   Allergic/Immunologic: Negative.   Neurological: Negative.   Hematological: Negative.   Psychiatric/Behavioral: Negative.      Today's Vitals   01/06/20 0912  BP: 132/80  Pulse: 76  Temp: 98.2 F (36.8 C)  TempSrc: Oral  SpO2: 98%  Weight: (!) 334 lb 3.2 oz (151.6 kg)  Height: 5' 10.4" (1.788 m)  PainSc: 0-No pain   Body mass index is 47.41 kg/m.   Objective:  Physical Exam  Vitals reviewed.  Constitutional:      Appearance: Normal appearance. She is well-developed.  HENT:     Head: Normocephalic and atraumatic.     Right Ear: Hearing, tympanic membrane, ear canal and external ear normal. There is no impacted cerumen.     Left Ear: Hearing, tympanic membrane, ear canal and external ear normal. There is no impacted cerumen.     Nose:     Comments: Deferred - masked    Mouth/Throat:     Comments: Deferred - masked Eyes:     General: Lids are normal.     Extraocular Movements: Extraocular movements intact.     Conjunctiva/sclera:  Conjunctivae normal.     Pupils: Pupils are equal, round, and reactive to light.     Funduscopic exam:    Right eye: No papilledema.        Left eye: No papilledema.  Neck:     Thyroid: No thyroid mass.     Vascular: No carotid bruit.  Cardiovascular:     Rate and Rhythm: Normal rate and regular rhythm.     Pulses: Normal pulses.     Heart sounds: Normal heart sounds. No murmur.  Pulmonary:     Effort: Pulmonary effort is normal. No respiratory distress.     Breath sounds: Normal breath sounds.  Abdominal:     General: Abdomen is flat. Bowel sounds are normal.     Palpations: Abdomen is soft.  Musculoskeletal:        General: No swelling. Normal range of motion.     Cervical back: Full passive range of motion without pain, normal range of motion and neck supple.     Right lower leg: No edema.     Left lower leg: No edema.  Skin:    General: Skin is warm and dry.     Capillary Refill: Capillary refill takes less than 2 seconds.  Neurological:     General: No focal deficit present.     Mental Status: She is alert and oriented to person, place, and time.     Cranial Nerves: No cranial nerve deficit.     Sensory: No sensory deficit.  Psychiatric:        Mood and Affect: Mood normal.        Behavior: Behavior normal.        Thought Content: Thought content normal.        Judgment: Judgment normal.         Assessment And Plan:    1. Routine general medical examination at a health care facility . Behavior modifications discussed and diet history reviewed.   . Pt will continue to exercise regularly and modify diet with low GI, plant based foods and decrease intake of processed foods.  . Recommend intake of daily multivitamin, Vitamin D, and calcium.  . Recommend mammogram for preventive screenings, as well as recommend immunizations that include influenza, TDAP (up to date) . Will get a copy of her records from Dr. Garwin Brothers  2. Essential hypertension . B/P is controlled.   . CMP ordered to check renal function.  . The importance of regular exercise and dietary modification was stressed to the patient.  . Stressed importance of losing ten percent of her body weight to help with B/P control.  . The weight loss would help with decreasing cardiac and cancer risk as well.  . EKG done with NSR HR 85 - POCT Urinalysis Dipstick (81002) - POCT UA - Microalbumin - EKG 12-Lead  3. Encounter for tobacco use cessation counseling Smoking cessation instruction/counseling given:  counseled patient on the dangers of tobacco use, advised patient to stop smoking, and reviewed strategies to maximize success  She is aware to monitor for nightmares or change in behavior Encouraged to set a 12 week stop date - varenicline (CHANTIX STARTING MONTH PAK) 0.5 MG X 11 & 1 MG X 42 tablet; Take one 0.5 mg tablet by mouth once daily for 3 days, then increase to one 0.5 mg tablet twice daily for 4 days, then increase to one 1 mg tablet twice daily.  Dispense: 53 tablet; Refill: 0  4. Encounter for screening mammogram for breast cancer  Pt instructed on Self Breast Exam.According to ACOG guidelines Women aged 47 and older are recommended to get an annual mammogram. Form completed and given to patient contact the [facility] for appointment scheduling.   Pt encouraged to get annual mammogram - Mammogram Digital Screening; Future  5. Morbid obesity with BMI of 45.0-49.9, adult (HCC)  I have encouraged her to increase her physical activity and to eat a healthy   6. Prediabetes  She is taking ozempic    [name], FNP    THE PATIENT IS ENCOURAGED TO PRACTICE SOCIAL DISTANCING DUE TO THE COVID-19 PANDEMIC.

## 2020-01-06 NOTE — Patient Instructions (Signed)
 Health Maintenance, Female Adopting a healthy lifestyle and getting preventive care are important in promoting health and wellness. Ask your health care provider about:  The right schedule for you to have regular tests and exams.  Things you can do on your own to prevent diseases and keep yourself healthy. What should I know about diet, weight, and exercise? Eat a healthy diet   Eat a diet that includes plenty of vegetables, fruits, low-fat dairy products, and lean protein.  Do not eat a lot of foods that are high in solid fats, added sugars, or sodium. Maintain a healthy weight Body mass index (BMI) is used to identify weight problems. It estimates body fat based on height and weight. Your health care provider can help determine your BMI and help you achieve or maintain a healthy weight. Get regular exercise Get regular exercise. This is one of the most important things you can do for your health. Most adults should:  Exercise for at least 150 minutes each week. The exercise should increase your heart rate and make you sweat (moderate-intensity exercise).  Do strengthening exercises at least twice a week. This is in addition to the moderate-intensity exercise.  Spend less time sitting. Even light physical activity can be beneficial. Watch cholesterol and blood lipids Have your blood tested for lipids and cholesterol at 40 years of age, then have this test every 5 years. Have your cholesterol levels checked more often if:  Your lipid or cholesterol levels are high.  You are older than 40 years of age.  You are at high risk for heart disease. What should I know about cancer screening? Depending on your health history and family history, you may need to have cancer screening at various ages. This may include screening for:  Breast cancer.  Cervical cancer.  Colorectal cancer.  Skin cancer.  Lung cancer. What should I know about heart disease, diabetes, and high blood  pressure? Blood pressure and heart disease  High blood pressure causes heart disease and increases the risk of stroke. This is more likely to develop in people who have high blood pressure readings, are of African descent, or are overweight.  Have your blood pressure checked: ? Every 3-5 years if you are 18-39 years of age. ? Every year if you are 40 years old or older. Diabetes Have regular diabetes screenings. This checks your fasting blood sugar level. Have the screening done:  Once every three years after age 40 if you are at a normal weight and have a low risk for diabetes.  More often and at a younger age if you are overweight or have a high risk for diabetes. What should I know about preventing infection? Hepatitis B If you have a higher risk for hepatitis B, you should be screened for this virus. Talk with your health care provider to find out if you are at risk for hepatitis B infection. Hepatitis C Testing is recommended for:  Everyone born from 1945 through 1965.  Anyone with known risk factors for hepatitis C. Sexually transmitted infections (STIs)  Get screened for STIs, including gonorrhea and chlamydia, if: ? You are sexually active and are younger than 40 years of age. ? You are older than 40 years of age and your health care provider tells you that you are at risk for this type of infection. ? Your sexual activity has changed since you were last screened, and you are at increased risk for chlamydia or gonorrhea. Ask your health care provider   if you are at risk.  Ask your health care provider about whether you are at high risk for HIV. Your health care provider may recommend a prescription medicine to help prevent HIV infection. If you choose to take medicine to prevent HIV, you should first get tested for HIV. You should then be tested every 3 months for as long as you are taking the medicine. Pregnancy  If you are about to stop having your period (premenopausal) and  you may become pregnant, seek counseling before you get pregnant.  Take 400 to 800 micrograms (mcg) of folic acid every day if you become pregnant.  Ask for birth control (contraception) if you want to prevent pregnancy. Osteoporosis and menopause Osteoporosis is a disease in which the bones lose minerals and strength with aging. This can result in bone fractures. If you are 65 years old or older, or if you are at risk for osteoporosis and fractures, ask your health care provider if you should:  Be screened for bone loss.  Take a calcium or vitamin D supplement to lower your risk of fractures.  Be given hormone replacement therapy (HRT) to treat symptoms of menopause. Follow these instructions at home: Lifestyle  Do not use any products that contain nicotine or tobacco, such as cigarettes, e-cigarettes, and chewing tobacco. If you need help quitting, ask your health care provider.  Do not use street drugs.  Do not share needles.  Ask your health care provider for help if you need support or information about quitting drugs. Alcohol use  Do not drink alcohol if: ? Your health care provider tells you not to drink. ? You are pregnant, may be pregnant, or are planning to become pregnant.  If you drink alcohol: ? Limit how much you use to 0-1 drink a day. ? Limit intake if you are breastfeeding.  Be aware of how much alcohol is in your drink. In the [place] one drink equals one 12 oz bottle of beer (355 mL), one 5 oz glass of wine (148 mL), or one 1 oz glass of hard liquor (44 mL). General instructions  Schedule regular health, dental, and eye exams.  Stay current with your vaccines.  Tell your health care provider if: ? You often feel depressed. ? You have ever been abused or do not feel safe at home. Summary  Adopting a healthy lifestyle and getting preventive care are important in promoting health and wellness.  Follow your health care provider's instructions about healthy  diet, exercising, and getting tested or screened for diseases.  Follow your health care provider's instructions on monitoring your cholesterol and blood pressure. This information is not intended to replace advice given to you by your health care provider. Make sure you discuss any questions you have with your health care provider. Document Revised: 08/06/2018 Document Reviewed: 08/06/2018 Elsevier Patient Education  2020 Elsevier Inc.   Fat and Cholesterol Restricted Eating Plan Eating a diet that limits fat and cholesterol may help lower your risk for heart disease and other conditions. Your body needs fat and cholesterol for basic functions, but eating too much of these things can be harmful to your health. Your health care provider may order lab tests to check your blood fat (lipid) and cholesterol levels. This helps your health care provider understand your risk for certain conditions and whether you need to make diet changes. Work with your health care provider or dietitian to make an eating plan that is right for you. Your plan includes:    Limit your fat intake to ______% or less of your total calories a day.  Limit your saturated fat intake to ______% or less of your total calories a day.  Limit the amount of cholesterol in your diet to less than _________mg a day.  Eat ___________ g of fiber a day. What are tips for following this plan? General guidelines   If you are overweight, work with your health care provider to lose weight safely. Losing just 5-10% of your body weight can improve your overall health and help prevent diseases such as diabetes and heart disease.  Avoid: ? Foods with added sugar. ? Fried foods. ? Foods that contain partially hydrogenated oils, including stick margarine, some tub margarines, cookies, crackers, and other baked goods.  Limit alcohol intake to no more than 1 drink a day for nonpregnant women and 2 drinks a day for men. One drink equals 12 oz  of beer, 5 oz of wine, or 1 oz of hard liquor. Reading food labels  Check food labels for: ? Trans fats, partially hydrogenated oils, or high amounts of saturated fat. Avoid foods that contain saturated fat and trans fat. ? The amount of cholesterol in each serving. Try to eat no more than 200 mg of cholesterol each day. ? The amount of fiber in each serving. Try to eat at least 20-30 g of fiber each day.  Choose foods with healthy fats, such as: ? Monounsaturated and polyunsaturated fats. These include olive and canola oil, flaxseeds, walnuts, almonds, and seeds. ? Omega-3 fats. These are found in foods such as salmon, mackerel, sardines, tuna, flaxseed oil, and ground flaxseeds.  Choose grain products that have whole grains. Look for the word "whole" as the first word in the ingredient list. Cooking  Cook foods using methods other than frying. Baking, boiling, grilling, and broiling are some healthy options.  Eat more home-cooked food and less restaurant, buffet, and fast food.  Avoid cooking using saturated fats. ? Animal sources of saturated fats include meats, butter, and cream. ? Plant sources of saturated fats include palm oil, palm kernel oil, and coconut oil. Meal planning   At meals, imagine dividing your plate into fourths: ? Fill one-half of your plate with vegetables and green salads. ? Fill one-fourth of your plate with whole grains. ? Fill one-fourth of your plate with lean protein foods.  Eat fish that is high in omega-3 fats at least two times a week.  Eat more foods that contain fiber, such as whole grains, beans, apples, broccoli, carrots, peas, and barley. These foods help promote healthy cholesterol levels in the blood. Recommended foods Grains  Whole grains, such as whole wheat or whole grain breads, crackers, cereals, and pasta. Unsweetened oatmeal, bulgur, barley, quinoa, or brown rice. Corn or whole wheat flour tortillas. Vegetables  Fresh or frozen  vegetables (raw, steamed, roasted, or grilled). Green salads. Fruits  All fresh, canned (in natural juice), or frozen fruits. Meats and other protein foods  Ground beef (85% or leaner), grass-fed beef, or beef trimmed of fat. Skinless chicken or turkey. Ground chicken or turkey. Pork trimmed of fat. All fish and seafood. Egg whites. Dried beans, peas, or lentils. Unsalted nuts or seeds. Unsalted canned beans. Natural nut butters without added sugar and oil. Dairy  Low-fat or nonfat dairy products, such as skim or 1% milk, 2% or reduced-fat cheeses, low-fat and fat-free ricotta or cottage cheese, or plain low-fat and nonfat yogurt. Fats and oils  Tub margarine without trans fats.   Light or reduced-fat mayonnaise and salad dressings. Avocado. Olive, canola, sesame, or safflower oils. The items listed above may not be a complete list of recommended foods or beverages. Contact your dietitian for more options. Foods to avoid Grains  White bread. White pasta. White rice. Cornbread. Bagels, pastries, and croissants. Crackers and snack foods that contain trans fat and hydrogenated oils. Vegetables  Vegetables cooked in cheese, cream, or butter sauce. Fried vegetables. Fruits  Canned fruit in heavy syrup. Fruit in cream or butter sauce. Fried fruit. Meats and other protein foods  Fatty cuts of meat. Ribs, chicken wings, bacon, sausage, bologna, salami, chitterlings, fatback, hot dogs, bratwurst, and packaged lunch meats. Liver and organ meats. Whole eggs and egg yolks. Chicken and turkey with skin. Fried meat. Dairy  Whole or 2% milk, cream, half-and-half, and cream cheese. Whole milk cheeses. Whole-fat or sweetened yogurt. Full-fat cheeses. Nondairy creamers and whipped toppings. Processed cheese, cheese spreads, and cheese curds. Beverages  Alcohol. Sugar-sweetened drinks such as sodas, lemonade, and fruit drinks. Fats and oils  Butter, stick margarine, lard, shortening, ghee, or bacon  fat. Coconut, palm kernel, and palm oils. Sweets and desserts  Corn syrup, sugars, honey, and molasses. Candy. Jam and jelly. Syrup. Sweetened cereals. Cookies, pies, cakes, donuts, muffins, and ice cream. The items listed above may not be a complete list of foods and beverages to avoid. Contact your dietitian for more information. Summary  Your body needs fat and cholesterol for basic functions. However, eating too much of these things can be harmful to your health.  Work with your health care provider and dietitian to follow a diet low in fat and cholesterol. Doing this may help lower your risk for heart disease and other conditions.  Choose healthy fats, such as monounsaturated and polyunsaturated fats, and foods high in omega-3 fatty acids.  Eat fiber-rich foods, such as whole grains, beans, peas, fruits, and vegetables.  Limit or avoid alcohol, fried foods, and foods high in saturated fats, partially hydrogenated oils, and sugar. This information is not intended to replace advice given to you by your health care provider. Make sure you discuss any questions you have with your health care provider. Document Revised: 07/26/2017 Document Reviewed: 04/30/2017 Elsevier Patient Education  2020 Elsevier Inc.  American Heart Association (AHA) Exercise Recommendation  Being physically active is important to prevent heart disease and stroke, the nation's No. 1and No. 5killers. To improve overall cardiovascular health, we suggest at least 150 minutes per week of moderate exercise or 75 minutes per week of vigorous exercise (or a combination of moderate and vigorous activity). Thirty minutes a day, five times a week is an easy goal to remember. You will also experience benefits even if you divide your time into two or three segments of 10 to 15 minutes per day.  For people who would benefit from lowering their blood pressure or cholesterol, we recommend 40 minutes of aerobic exercise of moderate to  vigorous intensity three to four times a week to lower the risk for heart attack and stroke.  Physical activity is anything that makes you move your body and burn calories.  This includes things like climbing stairs or playing sports. Aerobic exercises benefit your heart, and include walking, jogging, swimming or biking. Strength and stretching exercises are best for overall stamina and flexibility.  The simplest, positive change you can make to effectively improve your heart health is to start walking. It's enjoyable, free, easy, social and great exercise. A walking program is flexible   and boasts high success rates because people can stick with it. It's easy for walking to become a regular and satisfying part of life.   For Overall Cardiovascular Health:  At least 30 minutes of moderate-intensity aerobic activity at least 5 days per week for a total of 150  OR   At least 25 minutes of vigorous aerobic activity at least 3 days per week for a total of 75 minutes; or a combination of moderate- and vigorous-intensity aerobic activity  AND   Moderate- to high-intensity muscle-strengthening activity at least 2 days per week for additional health benefits.  For Lowering Blood Pressure and Cholesterol  An average 40 minutes of moderate- to vigorous-intensity aerobic activity 3 or 4 times per week  What if I can't make it to the time goal? Something is always better than nothing! And everyone has to start somewhere. Even if you've been sedentary for years, today is the day you can begin to make healthy changes in your life. If you don't think you'll make it for 30 or 40 minutes, set a reachable goal for today. You can work up toward your overall goal by increasing your time as you get stronger. Don't let all-or-nothing thinking rob you of doing what you can every day.  Source:http://www.heart.org    

## 2020-01-07 ENCOUNTER — Other Ambulatory Visit: Payer: Self-pay | Admitting: Podiatry

## 2020-01-13 ENCOUNTER — Telehealth: Payer: Self-pay

## 2020-01-13 NOTE — Telephone Encounter (Signed)
Per JM call pt make her aware to call Dr. Cherly Hensen office to check on the referral, name not in the chart JM unable to check on the status. LVM for pt to call the offic ew/the information

## 2020-01-23 ENCOUNTER — Ambulatory Visit: Payer: 59 | Attending: Internal Medicine

## 2020-01-23 DIAGNOSIS — Z23 Encounter for immunization: Secondary | ICD-10-CM

## 2020-01-23 MED FILL — AMLODIPINE BESYLATE 5 MG TA: 5 | 30 days supply | Qty: 30 | Fill #2

## 2020-01-23 NOTE — Progress Notes (Signed)
   Covid-[facility] Vaccination [facility]  Name:  Leslie Mendoza    MRN: [id number] DOB: May 14, 1981  01/23/2020  Leslie Mendoza was observed post Covid-19 immunization for 15 minutes without incident. She was provided with Vaccine Information Sheet and instruction to access the V-Safe system.   Leslie Mendoza was instructed to call 911 with any severe reactions post vaccine: Marland Kitchen Difficulty breathing  . Swelling of face and throat  . A fast heartbeat  . A bad rash all over body  . Dizziness and weakness   Immunizations Administered    Name Date Dose VIS Date Route   Pfizer COVID-19 Vaccine 01/23/2020  8:02 AM 0.3 mL 10/21/2018 Intramuscular   Manufacturer: [employer], [employer]   Lot: [id number]   NDC: [address]-2

## 2020-02-20 MED FILL — ATORVASTATIN CALCIUM 10 MG: 10 | 60 days supply | Qty: 60 | Fill #1

## 2020-02-23 ENCOUNTER — Other Ambulatory Visit: Payer: Self-pay | Admitting: Nurse Practitioner

## 2020-02-23 DIAGNOSIS — I10 Essential (primary) hypertension: Secondary | ICD-10-CM

## 2020-02-23 MED FILL — AMLODIPINE BESYLATE 5 MG TA: 5 | 90 days supply | Qty: 90 | Fill #0

## 2020-02-25 ENCOUNTER — Other Ambulatory Visit: Payer: Self-pay | Admitting: Nurse Practitioner

## 2020-02-25 DIAGNOSIS — I10 Essential (primary) hypertension: Secondary | ICD-10-CM

## 2020-02-25 MED FILL — HYDROCHLOROTHIAZIDE 12.5 MG: 12.5 | 90 days supply | Qty: 90 | Fill #0

## 2020-03-03 ENCOUNTER — Other Ambulatory Visit: Payer: Self-pay | Admitting: Nurse Practitioner

## 2020-03-03 ENCOUNTER — Other Ambulatory Visit: Payer: Self-pay

## 2020-03-03 ENCOUNTER — Ambulatory Visit
Admission: RE | Admit: 2020-03-03 | Discharge: 2020-03-03 | Disposition: A | Discharge status: Home / Self Care | Payer: 59 | Source: Ambulatory Visit | Attending: Nurse Practitioner | Admitting: Nurse Practitioner

## 2020-03-03 DIAGNOSIS — Z1231 Encounter for screening mammogram for malignant neoplasm of breast: Secondary | ICD-10-CM

## 2020-03-04 ENCOUNTER — Other Ambulatory Visit: Payer: Self-pay

## 2020-03-04 ENCOUNTER — Other Ambulatory Visit (HOSPITAL_COMMUNITY): Payer: Self-pay | Admitting: Podiatry

## 2020-03-04 ENCOUNTER — Ambulatory Visit: Payer: 59 | Admitting: Podiatry

## 2020-03-04 ENCOUNTER — Encounter: Payer: Self-pay | Admitting: Podiatry

## 2020-03-04 DIAGNOSIS — M7662 Achilles tendinitis, left leg: Secondary | ICD-10-CM | POA: Diagnosis not present

## 2020-03-04 DIAGNOSIS — M7661 Achilles tendinitis, right leg: Secondary | ICD-10-CM

## 2020-03-04 MED ORDER — MELOXICAM 15 MG PO TABS: 15.0000 mg | ORAL_TABLET | Freq: Every day | ORAL | 3 refills | Status: DC

## 2020-03-04 MED FILL — MELOXICAM 15 MG TABLET: 15 | 90 days supply | Qty: 90 | Fill #0

## 2020-03-04 NOTE — Progress Notes (Signed)
   HPI: female 39 y.o. 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  Patient states that she wears her custom orthotics every single day.  She also takes the meloxicam as needed.  She states that the meloxicam helped significantly.  The orthotics do not necessarily alleviate her Achilles pain but they are comfortable.  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   Past Medical History:  Diagnosis Date  . Achilles tendinitis   . Hypertension       Physical Exam: General: The patient is alert and oriented x3 in no acute distress.  Dermatology: Skin is warm, dry and supple bilateral lower extremities. Negative for open lesions or macerations.  Vascular: Palpable pedal pulses bilaterally. No edema or erythema noted. Capillary refill within normal limits.  Neurological: Epicritic and protective threshold grossly intact bilaterally.   Musculoskeletal Exam: Pain on palpation noted to the posterior tubercle of the bilateral calcaneus at the insertion of the Achilles tendon consistent with retrocalcaneal bursitis and a chronic Achilles tendinitis bilateral. Range of motion within normal limits. Muscle strength 5/5 in all muscle groups bilateral lower extremities.  Assessment: 1. Insertional Achilles tendinitis bilateral  2. Retrocalcaneal bursitis   Plan of Care:  1. Patient was evaluated. Radiographs were reviewed today. 2.  Patient states the anti-inflammatory steroid oral injections only alleviate pain for a few days.  Today we will not do injections.  We will reserve injections for acute severe flareups. 3.  Refill prescription for meloxicam 15 mg daily.  Refills as needed 4.  Continue wearing custom molded orthotics and good supportive sneakers 5.  Stressed the importance of daily stretching exercises to alleviate pressure from the Achilles tendon. 6.  Return to clinic as needed  Felecia Shelling,  DPM [facility]  Dr. Felecia Shelling, DPM    [address]                                        [place], [place] [address]                Office [phone]  Fax [phone]

## 2020-03-09 ENCOUNTER — Other Ambulatory Visit: Payer: Self-pay

## 2020-03-09 DIAGNOSIS — R7303 Prediabetes: Secondary | ICD-10-CM

## 2020-03-09 MED ORDER — OZEMPIC (0.25 OR 0.5 MG/DOSE) 2 MG/1.5ML ~~LOC~~ SOPN: 0.5000 mg | PEN_INJECTOR | SUBCUTANEOUS | 1 refills | Status: DC

## 2020-03-09 MED FILL — OZEMPIC 0.25 OR 0.5 MG/DOSE: 2 | 28 days supply | Qty: 2 | Fill #0

## 2020-03-31 MED FILL — OZEMPIC 0.25 OR 0.5 MG/DOSE: 2 | 28 days supply | Qty: 2 | Fill #1

## 2020-04-01 ENCOUNTER — Encounter: Payer: Self-pay | Admitting: Nurse Practitioner

## 2020-04-04 ENCOUNTER — Other Ambulatory Visit: Payer: Self-pay | Admitting: Nurse Practitioner

## 2020-04-04 DIAGNOSIS — Z716 Tobacco abuse counseling: Secondary | ICD-10-CM

## 2020-04-04 MED ORDER — CHANTIX STARTING MONTH PAK 0.5 MG X 11 & 1 MG X 42 PO TABS: ORAL_TABLET | ORAL | 0 refills | Status: DC

## 2020-04-04 MED FILL — APO-VARENICLINE 1 MG TABS: 1 | 28 days supply | Qty: 48 | Fill #0

## 2020-04-07 ENCOUNTER — Other Ambulatory Visit: Payer: Self-pay

## 2020-04-07 ENCOUNTER — Other Ambulatory Visit (HOSPITAL_COMMUNITY): Payer: Self-pay | Admitting: Nurse Practitioner

## 2020-04-07 ENCOUNTER — Encounter: Payer: Self-pay | Admitting: Nurse Practitioner

## 2020-04-07 ENCOUNTER — Ambulatory Visit: Payer: 59 | Admitting: Nurse Practitioner

## 2020-04-07 VITALS — BP 116/80 | HR 84 | Temp 98.2°F | Ht 70.4 in | Wt 334.0 lb

## 2020-04-07 DIAGNOSIS — Z1159 Encounter for screening for other viral diseases: Secondary | ICD-10-CM

## 2020-04-07 DIAGNOSIS — Z6841 Body Mass Index (BMI) 40.0 and over, adult: Secondary | ICD-10-CM | POA: Diagnosis not present

## 2020-04-07 DIAGNOSIS — R7303 Prediabetes: Secondary | ICD-10-CM

## 2020-04-07 DIAGNOSIS — Z91018 Allergy to other foods: Secondary | ICD-10-CM | POA: Diagnosis not present

## 2020-04-07 DIAGNOSIS — I10 Essential (primary) hypertension: Secondary | ICD-10-CM

## 2020-04-07 LAB — HEMOGLOBIN A1C
Est. average glucose Bld gHb Est-mCnc: 123 mg/dL
Hgb A1c MFr Bld: 5.9 % — ABNORMAL HIGH (ref 4.8–5.6)

## 2020-04-07 MED ORDER — WEGOVY 1 MG/0.5ML ~~LOC~~ SOAJ: 1.0000 mg | SUBCUTANEOUS | 0 refills | Status: DC

## 2020-04-07 NOTE — Progress Notes (Signed)
I,Katawbba Wiggins,acting as a Education administrator for [employer], FNP.,have documented all relevant documentation on the behalf of Minette Brine, FNP,as directed by  Minette Brine, FNP while in the presence of Minette Brine, [place].  This visit occurred during the SARS-CoV-2 public health emergency.  Safety protocols were in place, including screening questions prior to the visit, additional usage of staff PPE, and extensive cleaning of exam room while observing appropriate contact time as indicated for disinfecting solutions.  Subjective:     Patient ID: Leslie Mendoza , female    DOB: 13-Mar-1981 , 39 y.o.   MRN: [id number]   Chief Complaint  Patient presents with  . Prediabetes  . Hypertension    HPI  The patient is here today for a follow-up on prediabetes and blood pressure. She is moving more to get more physical activity.  She admits to not eating the best meals.    Hypertension This is a chronic problem. The current episode started more than 1 month ago. The problem is unchanged. The problem is controlled. Pertinent negatives include no anxiety, chest pain, headaches or palpitations. There are no associated agents to hypertension. Risk factors for coronary artery disease include obesity, sedentary lifestyle and smoking/tobacco exposure. Past treatments include lifestyle changes and diuretics. The current treatment provides moderate improvement. There are no compliance problems.  There is no history of angina. There is no history of chronic renal disease.     Past Medical History:  Diagnosis Date  . Achilles tendinitis   . Hypertension      Family History  Problem Relation Age of Onset  . Cancer Mother   . Hypertension Mother   . Breast cancer Mother   . Heart disease Father   . Diabetes Father   . COPD Brother      Current Outpatient Medications:  .  amLODipine (NORVASC) 5 MG tablet, TAKE 1 TABLET BY MOUTH ONCE A DAY, Disp: 90 tablet, Rfl: 1 .  atorvastatin (LIPITOR) 10 MG tablet,  TAKE 1 TABLET BY MOUTH ONCE DAILY, Disp: 60 tablet, Rfl: 2 .  hydrochlorothiazide (MICROZIDE) 12.5 MG capsule, TAKE 1 CAPSULE BY MOUTH ONCE DAILY, Disp: 90 capsule, Rfl: 1 .  meloxicam (MOBIC) 15 MG tablet, Take 1 tablet (15 mg total) by mouth daily., Disp: 90 tablet, Rfl: 3 .  Semaglutide,0.25 or 0.'5MG'$ /DOS, (OZEMPIC, 0.25 OR 0.5 MG/DOSE,) 2 MG/1.5ML SOPN, Inject 0.375 mLs (0.5 mg total) into the skin once a week., Disp: 3 mL, Rfl: 1 .  varenicline (CHANTIX STARTING MONTH PAK) 0.5 MG X 11 & 1 MG X 42 tablet, Take one 0.5 mg tablet by mouth once daily for 3 days, then increase to one 0.5 mg tablet twice daily for 4 days, then increase to one 1 mg tablet twice daily., Disp: 53 tablet, Rfl: 0 .  Semaglutide-Weight Management (WEGOVY) 1 MG/0.5ML SOAJ, Inject 1 mg into the skin once a week., Disp: 2 mL, Rfl: 0   Allergies  Allergen Reactions  . Pineapple     hives     Review of Systems  Constitutional: Negative.  Negative for fatigue.  Respiratory: Negative.  Negative for cough.   Cardiovascular: Negative.  Negative for chest pain, palpitations and leg swelling.  Gastrointestinal: Negative.   Neurological: Negative for headaches.  Psychiatric/Behavioral: Negative.   All other systems reviewed and are negative.    Today's Vitals   04/07/20 0906  BP: 116/80  Pulse: 84  Temp: 98.2 F (36.8 C)  TempSrc: Oral  Weight: (!) 334 lb (151.5 kg)  Height: 5' 10.4" (1.788 m)   Body mass index is 47.38 kg/m.  Wt Readings from Last 3 Encounters:  04/07/20 (!) 334 lb (151.5 kg)  01/06/20 (!) 334 lb 3.2 oz (151.6 kg)  11/12/19 (!) 329 lb 12.8 oz (149.6 kg)   Objective:  Physical Exam Vitals and nursing note reviewed.  Constitutional:      Appearance: Normal appearance. She is obese.  HENT:     Head: Normocephalic and atraumatic.  Cardiovascular:     Rate and Rhythm: Normal rate and regular rhythm.     Heart sounds: Normal heart sounds.  Pulmonary:     Breath sounds: Normal breath sounds.   Skin:    General: Skin is warm.  Neurological:     General: No focal deficit present.     Mental Status: She is alert and oriented to person, place, and time.         Assessment And Plan:     1. Prediabetes  Chronic,   Continue with Ozempic at 0.5 mg until finished with the current pen  Will start her on Wegovy for weight loss  She is encouraged to avoid sugary foods and drinks.  - BMP8+EGFR - Hemoglobin A1c  2. Essential hypertension  Chronic, good control  Continue with current medications  3. Class 3 severe obesity due to excess calories with serious comorbidity and body mass index (BMI) of 45.0 to 49.9 in adult [facility]) Chronic, her weight is stable Discussed healthy diet and regular exercise options  Encouraged to exercise at least 150 minutes per week with 2 days of strength training Wegovy teaching done  Return in 2 months for weight check. - Semaglutide-Weight Management (WEGOVY) 1 MG/0.5ML SOAJ; Inject 1 mg into the skin once a week.  Dispense: 2 mL; Refill: 0  4. 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 Encounter for hepatitis C screening test for low risk patient  Will check Hepatitis C screening due to recent recommendations to screen all adults 18 years and older - Hepatitis C antibody  She is encouraged to strive for BMI less than 30 to decrease cardiac risk. Advised to aim for at least 150 minutes of exercise per week.  Will add to the list for influenza vaccines  Patient was given opportunity to ask questions. Patient verbalized understanding of the plan and was able to repeat key elements of the plan. All questions were answered to their satisfaction.  Minette Brine, FNP   I, Minette Brine, FNP, have reviewed all documentation for this visit. The documentation on 04/07/20 for the exam, diagnosis, procedures, and orders are all accurate and complete.  THE PATIENT IS ENCOURAGED TO PRACTICE SOCIAL DISTANCING DUE TO THE  COVID-19 PANDEMIC.

## 2020-04-07 NOTE — Patient Instructions (Signed)

## 2020-04-10 LAB — ALLERGENS(96) FOODS
Allergen Apple, IgE: <0.1 kU/L
Allergen Banana IgE: 0.37 kU/L — AB
Allergen Barley IgE: <0.1 kU/L
Allergen Black Pepper IgE: <0.1 kU/L
Allergen Blueberry IgE: <0.1 kU/L
Allergen Broccoli: <0.1 kU/L
Allergen Cabbage IgE: <0.1 kU/L
Allergen Carrot IgE: <0.1 kU/L
Allergen Cauliflower IgE: <0.1 kU/L
Allergen Celery IgE: <0.1 kU/L
Allergen Cinnamon IgE: <0.1 kU/L
Allergen Coconut IgE: <0.1 kU/L
Allergen Corn, IgE: <0.1 kU/L
Allergen Cucumber IgE: <0.1 kU/L
Allergen Garlic IgE: <0.1 kU/L
Allergen Ginger IgE: <0.1 kU/L
Allergen Gluten IgE: <0.1 kU/L
Allergen Grape IgE: <0.1 kU/L
Allergen Grapefruit IgE: <0.1 kU/L
Allergen Green Bean IgE: <0.1 kU/L
Allergen Green Bell Pepper IgE: <0.1 kU/L
Allergen Green Pea IgE: <0.1 kU/L
Allergen Lamb IgE: <0.1 kU/L
Allergen Lettuce IgE: <0.1 kU/L
Allergen Lime IgE: <0.1 kU/L
Allergen Melon IgE: <0.1 kU/L
Allergen Oat IgE: <0.1 kU/L
Allergen Onion IgE: <0.1 kU/L
Allergen Pear IgE: <0.1 kU/L
Allergen Potato, White IgE: <0.1 kU/L
Allergen Rice IgE: <0.1 kU/L
Allergen Salmon IgE: <0.1 kU/L
Allergen Strawberry IgE: <0.1 kU/L
Allergen Sweet Potato IgE: <0.1 kU/L
Allergen Tomato, IgE: <0.1 kU/L
Allergen Turkey IgE: <0.1 kU/L
Allergen Watermelon IgE: <0.1 kU/L
Allergen, Peach f95: <0.1 kU/L
Basil: <0.1 kU/L
Beef IgE: <0.1 kU/L
C074-IgE Gelatin: <0.1 kU/L
Chicken IgE: <0.1 kU/L
Chocolate/Cacao IgE: <0.1 kU/L
Clam IgE: <0.1 kU/L
Codfish IgE: <0.1 kU/L
Coffee: <0.1 kU/L
Cranberry IgE: <0.1 kU/L
Egg White IgE: <0.1 kU/L
F020-IgE Almond: <0.1 kU/L
F023-IgE Crab: <0.1 kU/L
F045-IgE Yeast: <0.1 kU/L
F076-IgE Alpha Lactalbumin: <0.1 kU/L
F077-IgE Beta Lactoglobulin: <0.1 kU/L
F078-IgE Casein: <0.1 kU/L
F080-IgE Lobster: <0.1 kU/L
F081-IgE Cheese, Cheddar Type: <0.1 kU/L
F089-IgE Mustard: <0.1 kU/L
F096-IgE Avocado: <0.1 kU/L
F202-IgE Cashew Nut: <0.1 kU/L
F214-IgE Spinach: <0.1 kU/L
F222-IgE Tea: <0.1 kU/L
F242-IgE Bing Cherry: <0.1 kU/L
F247-IgE Honey: <0.1 kU/L
F261-IgE Asparagus: <0.1 kU/L
F262-IgE Eggplant: <0.1 kU/L
F265-IgE Cumin: <0.1 kU/L
F278-IgE Bayleaf (Laurel): <0.1 kU/L
F279-IgE Chili Pepper: <0.1 kU/L
F283-IgE Oregano: <0.1 kU/L
F300-IgE Goat's Milk: <0.1 kU/L
F342-IgE Olive, Black: <0.1 kU/L
F343-IgE Raspberry: <0.1 kU/L
Hops: <0.1 kU/L
IgE Egg (Yolk): <0.1 kU/L
Kidney Bean IgE: <0.1 kU/L
Lemon: <0.1 kU/L
Lima Bean IgE: <0.1 kU/L
Malt: <0.1 kU/L
Mushroom IgE: <0.1 kU/L
Orange: <0.1 kU/L
Peanut IgE: <0.1 kU/L
Pineapple IgE: <0.1 kU/L
Pork IgE: <0.1 kU/L
Pumpkin IgE: <0.1 kU/L
Red Beet: <0.1 kU/L
Rye IgE: <0.1 kU/L
Scallop IgE: <0.1 kU/L
Sesame Seed IgE: <0.1 kU/L
Shrimp IgE: <0.1 kU/L
Soybean IgE: <0.1 kU/L
Tuna: <0.1 kU/L
Vanilla: <0.1 kU/L
Walnut IgE: <0.1 kU/L
Wheat IgE: <0.1 kU/L
Whey: <0.1 kU/L
White Bean IgE: <0.1 kU/L

## 2020-04-10 LAB — BMP8+EGFR
BUN/Creatinine Ratio: 13 (ref 9–23)
BUN: 12 mg/dL (ref 6–24)
CO2: 23 mmol/L (ref 20–29)
Calcium: 9.4 mg/dL (ref 8.7–10.2)
Chloride: 104 mmol/L (ref 96–106)
Creatinine, Ser: 0.9 mg/dL (ref 0.57–1.00)
GFR calc Af Amer: 92 mL/min/{1.73_m2} (ref >59)
GFR calc non Af Amer: 80 mL/min/{1.73_m2} (ref >59)
Glucose: 108 mg/dL — ABNORMAL HIGH (ref 65–99)
Potassium: 3.7 mmol/L (ref 3.5–5.2)
Sodium: 141 mmol/L (ref 134–144)

## 2020-04-10 LAB — HEPATITIS C ANTIBODY: Hep C Virus Ab: <0.1 s/co ratio (ref 0.0–0.9)

## 2020-04-15 ENCOUNTER — Encounter: Payer: Self-pay | Admitting: Nurse Practitioner

## 2020-04-20 MED FILL — ATORVASTATIN CALCIUM 10 MG: 10 | 60 days supply | Qty: 60 | Fill #2

## 2020-04-29 MED FILL — OZEMPIC 0.25 OR 0.5 MG/DOSE: 2 | 28 days supply | Qty: 2 | Fill #2

## 2020-05-17 MED FILL — AMLODIPINE BESYLATE 5 MG TA: 5 | 90 days supply | Qty: 90 | Fill #1

## 2020-05-19 MED FILL — HYDROCHLOROTHIAZIDE 12.5 MG: 12.5 | 90 days supply | Qty: 90 | Fill #1

## 2020-05-26 MED FILL — OZEMPIC 0.25 OR 0.5 MG/DOSE: 2 | 28 days supply | Qty: 2 | Fill #3

## 2020-05-27 MED FILL — MELOXICAM 15 MG TABLET: 15 | 90 days supply | Qty: 90 | Fill #1

## 2020-06-13 ENCOUNTER — Other Ambulatory Visit: Payer: Self-pay

## 2020-06-13 ENCOUNTER — Encounter: Payer: Self-pay | Admitting: Nurse Practitioner

## 2020-06-13 ENCOUNTER — Ambulatory Visit: Payer: 59 | Admitting: Nurse Practitioner

## 2020-06-13 VITALS — BP 122/84 | HR 72 | Temp 98.4°F | Ht 70.4 in | Wt 328.4 lb

## 2020-06-13 DIAGNOSIS — I10 Essential (primary) hypertension: Secondary | ICD-10-CM | POA: Diagnosis not present

## 2020-06-13 DIAGNOSIS — R7303 Prediabetes: Secondary | ICD-10-CM

## 2020-06-13 DIAGNOSIS — Z6841 Body Mass Index (BMI) 40.0 and over, adult: Secondary | ICD-10-CM

## 2020-06-13 DIAGNOSIS — E782 Mixed hyperlipidemia: Secondary | ICD-10-CM

## 2020-06-13 MED ORDER — WEGOVY 1 MG/0.5ML ~~LOC~~ SOAJ: 1.0000 mg | SUBCUTANEOUS | 0 refills | Status: DC

## 2020-06-13 MED FILL — WEGOVY 1 MG/0.5ML SOAJ: 1 | 28 days supply | Qty: 2 | Fill #0

## 2020-06-13 NOTE — Patient Instructions (Addendum)

## 2020-06-13 NOTE — Progress Notes (Signed)
I,Yamilka Roman [employer] as a Neurosurgeon for [employer], FNP.,have documented all relevant documentation on the behalf of [name], FNP,as directed by  [name], FNP while in the presence of [name], FNP. This visit occurred during the SARS-CoV-2 public health emergency.  Safety protocols were in place, including screening questions prior to the visit, additional usage of staff PPE, and extensive cleaning of exam room while observing appropriate contact time as indicated for disinfecting solutions.  Subjective:     Patient ID: Leslie Mendoza , female    DOB: 12-11-1980 , 39 y.o.   MRN: [id number]   Chief Complaint  Patient presents with  . Weight Check    p    HPI  Patient is here for a weight check.  Wt Readings from Last 3 Encounters: 06/13/20 : (!) 328 lb 6.4 oz (149 kg) 04/07/20 : (!) 334 lb (151.5 kg) 01/06/20 : (!) 334 lb 3.2 oz (151.6 kg)  She is walking more on the weekend with 10,000-15,000 steps.  She has increased her water intake.  She has been doing better about not eating out as much.  She has increased her vegetable intake.  She admits to having cravings for sweets.    She has not been taking generic chantix.      Past Medical History:  Diagnosis Date  . Achilles tendinitis   . Hypertension      Family History  Problem Relation Age of Onset  . Cancer Mother   . Hypertension Mother   . Breast cancer Mother   . Heart disease Father   . Diabetes Father   . COPD Brother      Current Outpatient Medications:  .  amLODipine (NORVASC) 5 MG tablet, TAKE 1 TABLET BY MOUTH ONCE A DAY, Disp: 90 tablet, Rfl: 1 .  atorvastatin (LIPITOR) 10 MG tablet, TAKE 1 TABLET BY MOUTH ONCE DAILY, Disp: 60 tablet, Rfl: 2 .  hydrochlorothiazide (MICROZIDE) 12.5 MG capsule, TAKE 1 CAPSULE BY MOUTH ONCE DAILY, Disp: 90 capsule, Rfl: 1 .  meloxicam (MOBIC) 15 MG tablet, Take 1 tablet (15 mg total) by mouth daily., Disp: 90 tablet, Rfl: 3 .  Semaglutide,0.25 or  0.5MG /DOS, (OZEMPIC, 0.25 OR 0.5 MG/DOSE,) 2 MG/1.5ML SOPN, Inject 0.375 mLs (0.5 mg total) into the skin once a week., Disp: 3 mL, Rfl: 1 .  Semaglutide-Weight Management (WEGOVY) 1 MG/0.5ML SOAJ, Inject 1 mg into the skin once a week., Disp: 2 mL, Rfl: 0   Allergies  Allergen Reactions  . Pineapple     hives     Review of Systems  Constitutional: Negative.   Respiratory: Negative.  Negative for cough.   Cardiovascular: Negative.  Negative for chest pain, palpitations and leg swelling.  Gastrointestinal: Negative.   Skin: Negative.   Psychiatric/Behavioral: Negative.      Today's Vitals   06/13/20 0839  BP: 122/84  Pulse: 72  Temp: 98.4 F (36.9 C)  TempSrc: Oral  Weight: (!) 328 lb 6.4 oz (149 kg)  Height: 5' 10.4" (1.788 m)  PainSc: 0-No pain   Body mass index is 46.59 kg/m.   Objective:  Physical Exam Constitutional:      General: She is not in acute distress.    Appearance: Normal appearance. She is obese.  Pulmonary:     Effort: Pulmonary effort is normal.  Neurological:     General: No focal deficit present.     Mental Status: She is alert and oriented to person, place, and time.  Cranial Nerves: No cranial nerve deficit.  Psychiatric:        Mood and Affect: Mood normal.        Behavior: Behavior normal.        Thought Content: Thought content normal.        Judgment: Judgment normal.         Assessment And Plan:     1. Class 3 severe obesity due to excess calories with serious comorbidity and body mass index (BMI) of 45.0 to 49.9 in adult [facility])  Chronic,  She has lost 6 lbs since her last office visit   I have sent a Rx for wegovy she is to contact us if she can not get the medication  I have given her information on healthy weight with the CDC.  - Semaglutide-Weight Management (WEGOVY) 1 MG/0.5ML SOAJ; Inject 1 mg into the skin once a week.  Dispense: 2 mL; Refill: 0  2. Essential hypertension  Chronic, great control  Continue with  current medications  3. Prediabetes  Chronic, stable  No labs this visit  4. Mixed hyperlipidemia  Chronic, stable  Continue with low fat diet     Patient was given opportunity to ask questions. Patient verbalized understanding of the plan and was able to repeat key elements of the plan. All questions were answered to their satisfaction.   Jeanell Sparrow, FNP, have reviewed all documentation for this visit. The documentation on 06/13/20 for the exam, diagnosis, procedures, and orders are all accurate and complete.   THE PATIENT IS ENCOURAGED TO PRACTICE SOCIAL DISTANCING DUE TO THE COVID-19 PANDEMIC.

## 2020-06-20 ENCOUNTER — Other Ambulatory Visit: Payer: Self-pay | Admitting: Nurse Practitioner

## 2020-06-20 DIAGNOSIS — R7303 Prediabetes: Secondary | ICD-10-CM

## 2020-06-20 DIAGNOSIS — E782 Mixed hyperlipidemia: Secondary | ICD-10-CM

## 2020-06-21 ENCOUNTER — Other Ambulatory Visit (HOSPITAL_COMMUNITY): Payer: Self-pay | Admitting: Nurse Practitioner

## 2020-06-21 MED FILL — ATORVASTATIN CALCIUM 10 MG: 10 | 90 days supply | Qty: 90 | Fill #0

## 2020-06-23 ENCOUNTER — Other Ambulatory Visit: Payer: Self-pay | Admitting: Nurse Practitioner

## 2020-06-23 DIAGNOSIS — R7303 Prediabetes: Secondary | ICD-10-CM

## 2020-07-12 ENCOUNTER — Other Ambulatory Visit: Payer: Self-pay

## 2020-07-12 ENCOUNTER — Ambulatory Visit: Payer: 59 | Admitting: Nurse Practitioner

## 2020-07-12 ENCOUNTER — Encounter: Payer: Self-pay | Admitting: Nurse Practitioner

## 2020-07-12 VITALS — BP 122/80 | HR 70 | Temp 98.3°F | Ht 70.4 in | Wt 329.2 lb

## 2020-07-12 DIAGNOSIS — I10 Essential (primary) hypertension: Secondary | ICD-10-CM

## 2020-07-12 DIAGNOSIS — R7303 Prediabetes: Secondary | ICD-10-CM

## 2020-07-12 DIAGNOSIS — Z6841 Body Mass Index (BMI) 40.0 and over, adult: Secondary | ICD-10-CM | POA: Diagnosis not present

## 2020-07-12 DIAGNOSIS — E782 Mixed hyperlipidemia: Secondary | ICD-10-CM

## 2020-07-12 MED ORDER — WEGOVY 1.7 MG/0.75ML ~~LOC~~ SOAJ: 1.7000 mg | SUBCUTANEOUS | 0 refills | Status: DC

## 2020-07-12 MED FILL — WEGOVY 1.7 MG/0.75ML SOAJ: 1.7 | 28 days supply | Qty: 3 | Fill #0

## 2020-07-12 NOTE — Patient Instructions (Signed)

## 2020-07-12 NOTE — Progress Notes (Signed)
I,Katawbba Wiggins,acting as a Education administrator for [employer], FNP.,have documented all relevant documentation on the behalf of Minette Brine, FNP,as directed by  Minette Brine, FNP while in the presence of Minette Brine, [place].  This visit occurred during the SARS-CoV-2 public health emergency.  Safety protocols were in place, including screening questions prior to the visit, additional usage of staff PPE, and extensive cleaning of exam room while observing appropriate contact time as indicated for disinfecting solutions.  Subjective:     Patient ID: Leslie Mendoza , female    DOB: [birth year] , 39 y.o.   MRN: [id number]   Chief Complaint  Patient presents with  . Hypertension    HPI  The patient is here today for a follow-up on her blood pressure and weight.  Hypertension     Past Medical History:  Diagnosis Date  . Achilles tendinitis   . Hypertension      Family History  Problem Relation Age of Onset  . Cancer Mother   . Hypertension Mother   . Breast cancer Mother   . Heart disease Father   . Diabetes Father   . COPD Brother      Current Outpatient Medications:  .  amLODipine (NORVASC) 5 MG tablet, TAKE 1 TABLET BY MOUTH ONCE A DAY, Disp: 90 tablet, Rfl: 1 .  atorvastatin (LIPITOR) 10 MG tablet, TAKE 1 TABLET BY MOUTH ONCE DAILY, Disp: 60 tablet, Rfl: 2 .  hydrochlorothiazide (MICROZIDE) 12.5 MG capsule, TAKE 1 CAPSULE BY MOUTH ONCE DAILY, Disp: 90 capsule, Rfl: 1 .  meloxicam (MOBIC) 15 MG tablet, Take 1 tablet (15 mg total) by mouth daily., Disp: 90 tablet, Rfl: 3 .  Semaglutide-Weight Management (WEGOVY) 1.7 MG/0.75ML SOAJ, Inject 1.7 mg into the skin once a week., Disp: 3 mL, Rfl: 0   Allergies  Allergen Reactions  . Pineapple     hives     Review of Systems  Constitutional: Negative.   Respiratory: Negative.   Cardiovascular: Negative.   Gastrointestinal: Negative.   Psychiatric/Behavioral: Negative.   All other systems reviewed and are negative.    Today's  Vitals   07/12/20 0841  BP: 122/80  Pulse: 70  Temp: 98.3 F (36.8 C)  TempSrc: Oral  Weight: (!) 329 lb 3.2 oz (149.3 kg)  Height: 5' 10.4" (1.788 m)   Body mass index is 46.7 kg/m.  Wt Readings from Last 3 Encounters:  07/12/20 (!) 329 lb 3.2 oz (149.3 kg)  06/13/20 (!) 328 lb 6.4 oz (149 kg)  04/07/20 (!) 334 lb (151.5 kg)   Objective:  Physical Exam Vitals and nursing note reviewed.  Constitutional:      General: She is not in acute distress.    Appearance: Normal appearance. She is obese.  HENT:     Head: Normocephalic and atraumatic.  Cardiovascular:     Rate and Rhythm: Normal rate and regular rhythm.     Pulses: Normal pulses.     Heart sounds: Normal heart sounds. No murmur heard.   Pulmonary:     Effort: Pulmonary effort is normal. No respiratory distress.     Breath sounds: Normal breath sounds.  Musculoskeletal:     Right lower leg: Edema (trace - has just got off work) present.     Left lower leg: Edema (trace - has just got off work) present.  Skin:    General: Skin is warm.  Neurological:     General: No focal deficit present.     Mental Status: She is alert  and oriented to person, place, and time.  Psychiatric:        Mood and Affect: Mood normal.        Behavior: Behavior normal.        Thought Content: Thought content normal.        Judgment: Judgment normal.         Assessment And Plan:     1. Essential hypertension  Chronic, well controlled  Continue current medications - BMP8+EGFR  2. Class 3 severe obesity due to excess calories with serious comorbidity and body mass index (BMI) of 45.0 to 49.9 in adult [facility])  Her weight is currently stable. Continues to tolerate [place] well.    ASK- she is here for a weight check today  ASSESS - Body mass index is 46.7 kg/m., well tolerated, WAIST CIRCUMFERENCE 54 inches.   ADVISE  - on risk of cardiovascular disease currently has a diagnosis of hypertension, on medications.  Also   AGREE -  goal to not go back to her bad eating habits especially with Thanksgiving coming up.  Try to stress too much.    ASSIST - increased her dose of Wegovy to 1.7 mg  She is encouraged to strive for BMI less than 30 to decrease cardiac risk. Advised to aim for at least 150 minutes of exercise per week..  - Semaglutide-Weight Management (WEGOVY) 1.7 MG/0.75ML SOAJ; Inject 1.7 mg into the skin once a week.  Dispense: 3 mL; Refill: 0  3. Prediabetes  Chronic, continues to improve  Will check HgbA1c today  - Hemoglobin A1c  4. Mixed hyperlipidemia  Chronic, no current medications  Will check lipid panel - Lipid panel     Patient was given opportunity to ask questions. Patient verbalized understanding of the plan and was able to repeat key elements of the plan. All questions were answered to their satisfaction.   Teola Bradley, FNP, have reviewed all documentation for this visit. The documentation on 07/12/20 for the exam, diagnosis, procedures, and orders are all accurate and complete.  THE PATIENT IS ENCOURAGED TO PRACTICE SOCIAL DISTANCING DUE TO THE COVID-19 PANDEMIC.

## 2020-07-13 LAB — LIPID PANEL
Chol/HDL Ratio: 2.5 ratio (ref 0.0–4.4)
Cholesterol, Total: 147 mg/dL (ref 100–199)
HDL: 60 mg/dL (ref >39)
LDL Chol Calc (NIH): 71 mg/dL (ref 0–99)
Triglycerides: 84 mg/dL (ref 0–149)
VLDL Cholesterol Cal: 16 mg/dL (ref 5–40)

## 2020-07-13 LAB — BMP8+EGFR
BUN/Creatinine Ratio: 19 (ref 9–23)
BUN: 17 mg/dL (ref 6–24)
CO2: 25 mmol/L (ref 20–29)
Calcium: 9.6 mg/dL (ref 8.7–10.2)
Chloride: 102 mmol/L (ref 96–106)
Creatinine, Ser: 0.88 mg/dL (ref 0.57–1.00)
GFR calc Af Amer: 95 mL/min/{1.73_m2} (ref >59)
GFR calc non Af Amer: 82 mL/min/{1.73_m2} (ref >59)
Glucose: 75 mg/dL (ref 65–99)
Potassium: 4.3 mmol/L (ref 3.5–5.2)
Sodium: 139 mmol/L (ref 134–144)

## 2020-07-13 LAB — HEMOGLOBIN A1C
Est. average glucose Bld gHb Est-mCnc: 123 mg/dL
Hgb A1c MFr Bld: 5.9 % — ABNORMAL HIGH (ref 4.8–5.6)

## 2020-08-03 ENCOUNTER — Other Ambulatory Visit: Payer: Self-pay | Admitting: Nurse Practitioner

## 2020-08-03 DIAGNOSIS — Z6841 Body Mass Index (BMI) 40.0 and over, adult: Secondary | ICD-10-CM

## 2020-08-04 MED FILL — WEGOVY 1.7 MG/0.75ML SOAJ: 1.7 | 28 days supply | Qty: 3 | Fill #0

## 2020-08-15 ENCOUNTER — Other Ambulatory Visit (HOSPITAL_COMMUNITY): Payer: Self-pay | Admitting: Nurse Practitioner

## 2020-08-15 ENCOUNTER — Other Ambulatory Visit: Payer: Self-pay | Admitting: Nurse Practitioner

## 2020-08-15 DIAGNOSIS — I10 Essential (primary) hypertension: Secondary | ICD-10-CM

## 2020-08-15 MED FILL — AMLODIPINE BESYLATE 5 MG TA: 5 | 90 days supply | Qty: 90 | Fill #0

## 2020-08-17 ENCOUNTER — Other Ambulatory Visit (HOSPITAL_COMMUNITY): Payer: Self-pay | Admitting: Nurse Practitioner

## 2020-08-17 ENCOUNTER — Other Ambulatory Visit: Payer: Self-pay | Admitting: Nurse Practitioner

## 2020-08-17 DIAGNOSIS — I10 Essential (primary) hypertension: Secondary | ICD-10-CM

## 2020-08-17 MED FILL — HYDROCHLOROTHIAZIDE 12.5 MG: 12.5 | 90 days supply | Qty: 90 | Fill #0

## 2020-08-22 ENCOUNTER — Other Ambulatory Visit: Payer: Self-pay

## 2020-08-22 DIAGNOSIS — I10 Essential (primary) hypertension: Secondary | ICD-10-CM

## 2020-08-22 MED ORDER — HYDROCHLOROTHIAZIDE 12.5 MG PO CAPS: 12.5000 mg | ORAL_CAPSULE | Freq: Every day | ORAL | 1 refills | Status: DC

## 2020-08-22 MED ORDER — AMLODIPINE BESYLATE 5 MG PO TABS: 5.0000 mg | ORAL_TABLET | Freq: Every day | ORAL | 1 refills | Status: DC

## 2020-08-24 ENCOUNTER — Ambulatory Visit: Payer: 59 | Admitting: Nurse Practitioner

## 2020-08-24 ENCOUNTER — Encounter: Payer: Self-pay | Admitting: Nurse Practitioner

## 2020-08-24 ENCOUNTER — Other Ambulatory Visit: Payer: Self-pay

## 2020-08-24 ENCOUNTER — Other Ambulatory Visit (HOSPITAL_COMMUNITY): Payer: Self-pay | Admitting: Nurse Practitioner

## 2020-08-24 VITALS — BP 126/80 | HR 73 | Temp 98.5°F | Ht 70.4 in | Wt 322.4 lb

## 2020-08-24 DIAGNOSIS — R7303 Prediabetes: Secondary | ICD-10-CM

## 2020-08-24 DIAGNOSIS — Z6841 Body Mass Index (BMI) 40.0 and over, adult: Secondary | ICD-10-CM | POA: Diagnosis not present

## 2020-08-24 DIAGNOSIS — I10 Essential (primary) hypertension: Secondary | ICD-10-CM | POA: Diagnosis not present

## 2020-08-24 MED ORDER — WEGOVY 2.4 MG/0.75ML ~~LOC~~ SOAJ: 2.4000 mg | SUBCUTANEOUS | 1 refills | Status: DC

## 2020-08-24 NOTE — Patient Instructions (Addendum)

## 2020-08-24 NOTE — Progress Notes (Signed)
I,Yamilka Roman [employer] as a Neurosurgeon for [employer], FNP.,have documented all relevant documentation on the behalf of [name], FNP,as directed by  [name], FNP while in the presence of [name], FNP. This visit occurred during the SARS-CoV-2 public health emergency.  Safety protocols were in place, including screening questions prior to the visit, additional usage of staff PPE, and extensive cleaning of exam room while observing appropriate contact time as indicated for disinfecting solutions.  Subjective:     Patient ID: Leslie Mendoza , female    DOB: 09/18/1980 , 39 y.o.   MRN: [id number]   Chief Complaint  Patient presents with  . Weight Loss    HPI  Patient is here for a weight check.  Wt Readings from Last 3 Encounters: 08/24/20 : (!) 322 lb 6.4 oz (146.2 kg) 07/12/20 : (!) 329 lb 3.2 oz (149.3 kg) 06/13/20 : (!) 328 lb 6.4 oz (149 kg)  Continues to take Wegovy 1.7 mg weekly and tolerating well. She has cut back on sodas now once a week, increased her water intake and not eating out as much.      Past Medical History:  Diagnosis Date  . Achilles tendinitis   . Hypertension      Family History  Problem Relation Age of Onset  . Cancer Mother   . Hypertension Mother   . Breast cancer Mother   . Heart disease Father   . Diabetes Father   . COPD Brother      Current Outpatient Medications:  .  amLODipine (NORVASC) 5 MG tablet, Take 1 tablet (5 mg total) by mouth daily., Disp: 90 tablet, Rfl: 1 .  atorvastatin (LIPITOR) 10 MG tablet, TAKE 1 TABLET BY MOUTH ONCE DAILY, Disp: 60 tablet, Rfl: 2 .  hydrochlorothiazide (MICROZIDE) 12.5 MG capsule, Take 1 capsule (12.5 mg total) by mouth daily., Disp: 90 capsule, Rfl: 1 .  meloxicam (MOBIC) 15 MG tablet, Take 1 tablet (15 mg total) by mouth daily., Disp: 90 tablet, Rfl: 3 .  Semaglutide-Weight Management (WEGOVY) 2.4 MG/0.75ML SOAJ, Inject 2.4 mg into the skin once a week., Disp: 3 mL, Rfl: 1    Allergies  Allergen Reactions  . Pineapple     hives     Review of Systems  Constitutional: Negative.  Negative for fatigue.  Respiratory: Negative.   Cardiovascular: Negative for chest pain, palpitations and leg swelling.  Neurological: Negative.   Psychiatric/Behavioral: Negative.      Today's Vitals   08/24/20 0835  BP: 126/80  Pulse: 73  Temp: 98.5 F (36.9 C)  TempSrc: Oral  Weight: (!) 322 lb 6.4 oz (146.2 kg)  Height: 5' 10.4" (1.788 m)  PainSc: 0-No pain   Body mass index is 45.74 kg/m.   Objective:  Physical Exam Vitals and nursing note reviewed.  Constitutional:      General: She is not in acute distress.    Appearance: Normal appearance. She is obese.  HENT:     Head: Normocephalic and atraumatic.  Pulmonary:     Effort: Pulmonary effort is normal. No respiratory distress.  Skin:    General: Skin is warm.  Neurological:     General: No focal deficit present.     Mental Status: She is alert and oriented to person, place, and time.     Cranial Nerves: No cranial nerve deficit.  Psychiatric:        Mood and Affect: Mood normal.        Behavior: Behavior normal.  Thought Content: Thought content normal.        Judgment: Judgment normal.         Assessment And Plan:     1. Prediabetes  Chronic, stable  Continue with diet low in sugar and starches  2. Essential hypertension  Chronic, blood pressure is normal  Continue with current medications  3. Class 3 severe obesity due to excess calories with serious comorbidity and body mass index (BMI) of 45.0 to 49.9 in adult [facility])  Chronic  Discussed healthy diet and regular exercise options   Encouraged to exercise at least 150 minutes per week with 2 days of strength training  Continue with Wegovy increased her dose to 2.4 mg weekly, she is advised if she has trouble with getting the medication to call to office.  Return in 2 months for weight check. - Semaglutide-Weight Management  (WEGOVY) 2.4 MG/0.75ML SOAJ; Inject 2.4 mg into the skin once a week.  Dispense: 3 mL; Refill: 1     Patient was given opportunity to ask questions. Patient verbalized understanding of the plan and was able to repeat key elements of the plan. All questions were answered to their satisfaction.  [name], FNP   I, [name], FNP, have reviewed all documentation for this visit. The documentation on 08/24/20 for the exam, diagnosis, procedures, and orders are all accurate and complete.   THE PATIENT IS ENCOURAGED TO PRACTICE SOCIAL DISTANCING DUE TO THE COVID-19 PANDEMIC.

## 2020-08-25 MED FILL — MELOXICAM 15 MG TABLET: 15 | 90 days supply | Qty: 90 | Fill #2

## 2020-08-26 ENCOUNTER — Other Ambulatory Visit: Payer: Self-pay | Admitting: Nurse Practitioner

## 2020-08-26 DIAGNOSIS — Z6841 Body Mass Index (BMI) 40.0 and over, adult: Secondary | ICD-10-CM

## 2020-08-26 MED FILL — WEGOVY 1.7 MG/0.75ML SOAJ: 1.7 | 28 days supply | Qty: 3 | Fill #0

## 2020-08-29 ENCOUNTER — Other Ambulatory Visit: Payer: Self-pay | Admitting: Nurse Practitioner

## 2020-09-07 DIAGNOSIS — M7661 Achilles tendinitis, right leg: Secondary | ICD-10-CM | POA: Diagnosis not present

## 2020-09-07 DIAGNOSIS — M7662 Achilles tendinitis, left leg: Secondary | ICD-10-CM | POA: Diagnosis not present

## 2020-09-12 IMAGING — MG DIGITAL SCREENING BILAT W/ TOMO W/ CAD
6 of 10 series · 6 of 30 positions shown · non-contrast
Comparison: Previous exam(s).

ACR Breast Density Category a: The breast tissue is almost entirely
fatty.

CLINICAL DATA: Screening.

EXAM:
DIGITAL SCREENING BILATERAL MAMMOGRAM WITH TOMO AND CAD

[L CC synth-2D]
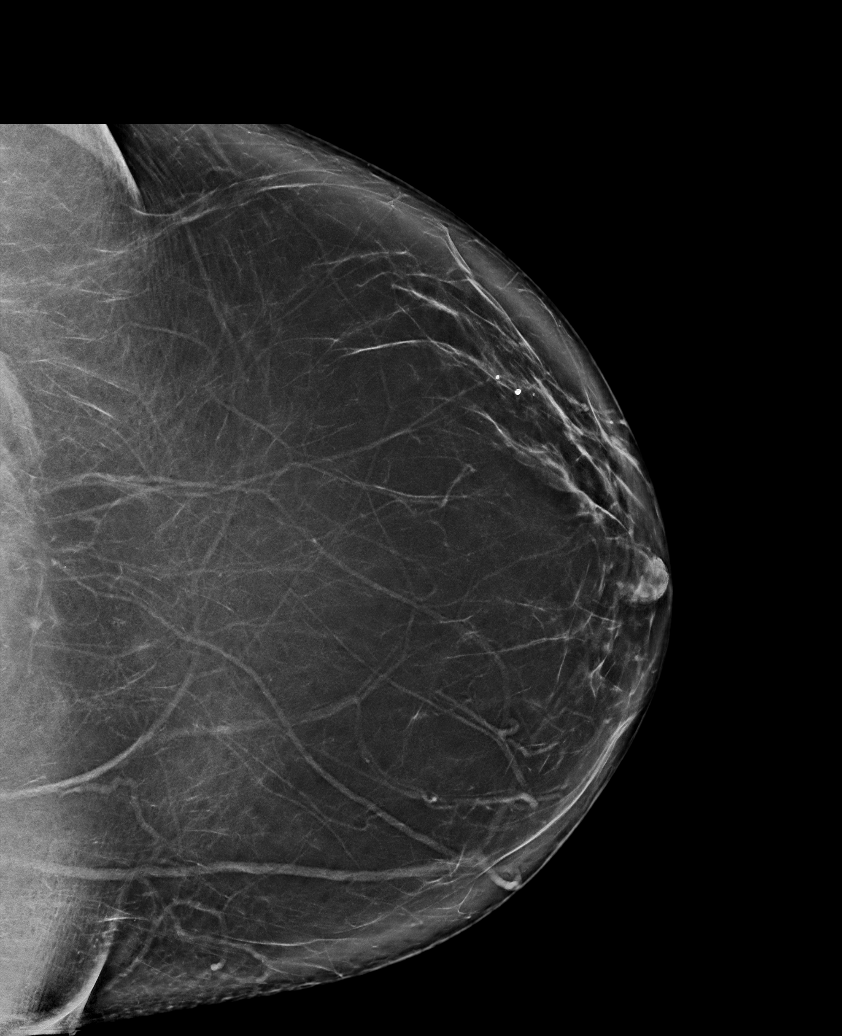

[L MLO synth-2D]
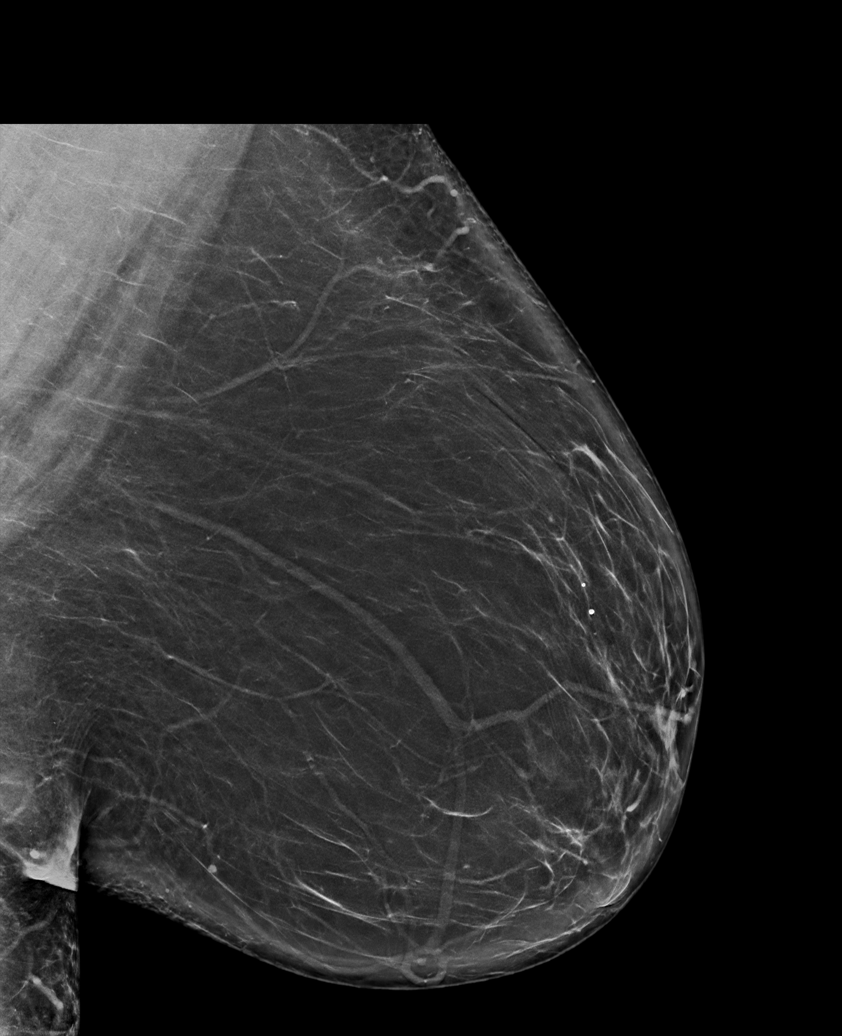

[R CC synth-2D (1 of 2)]
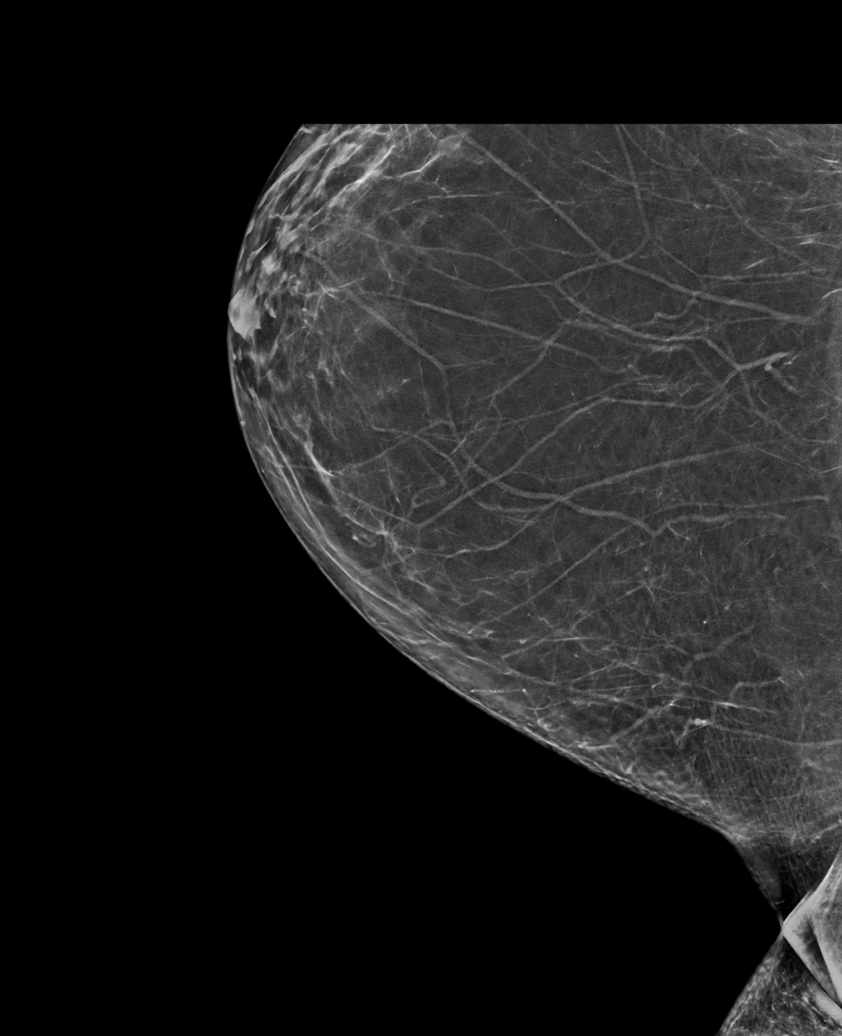

[R MLO synth-2D]
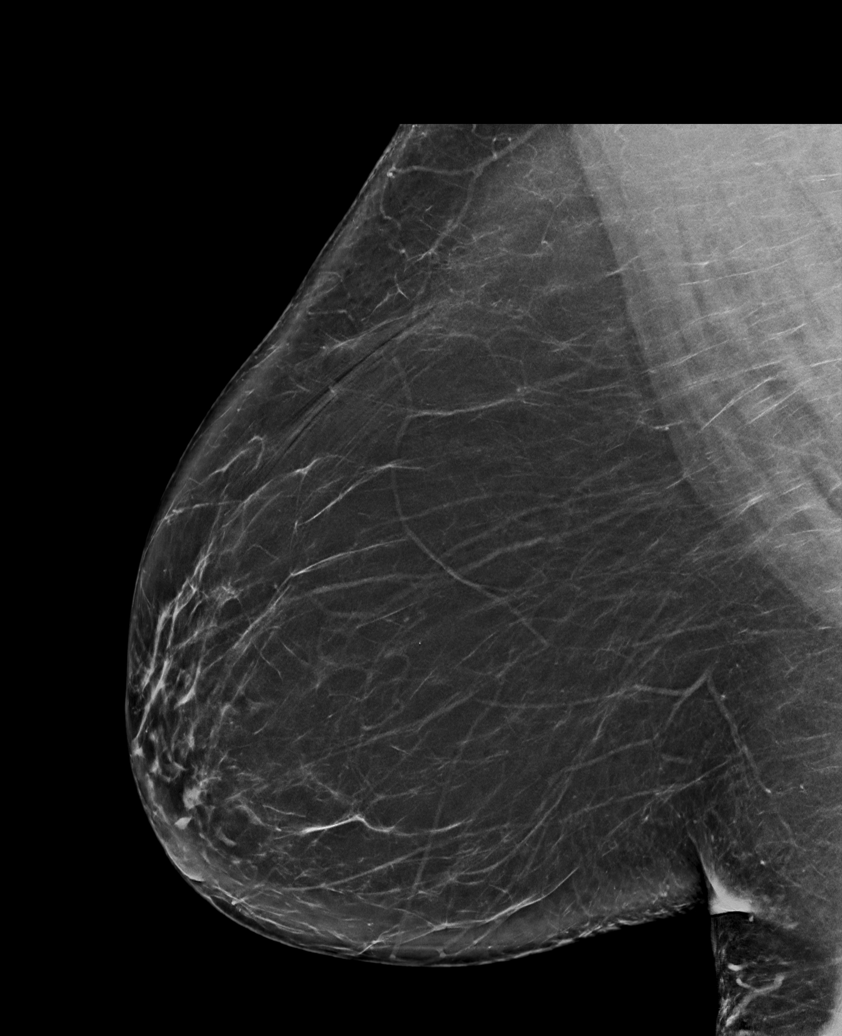

[R CC synth-2D (2 of 2)]
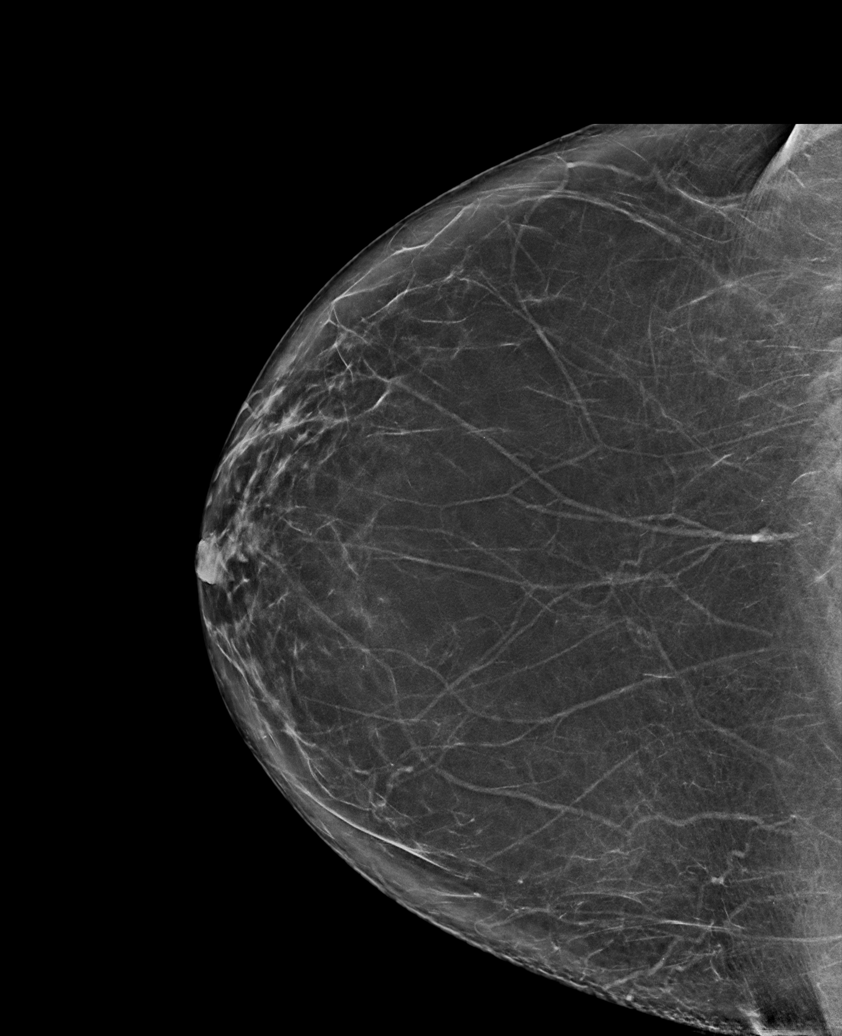

[L MLO tomo · tomo slice 46/91.0]
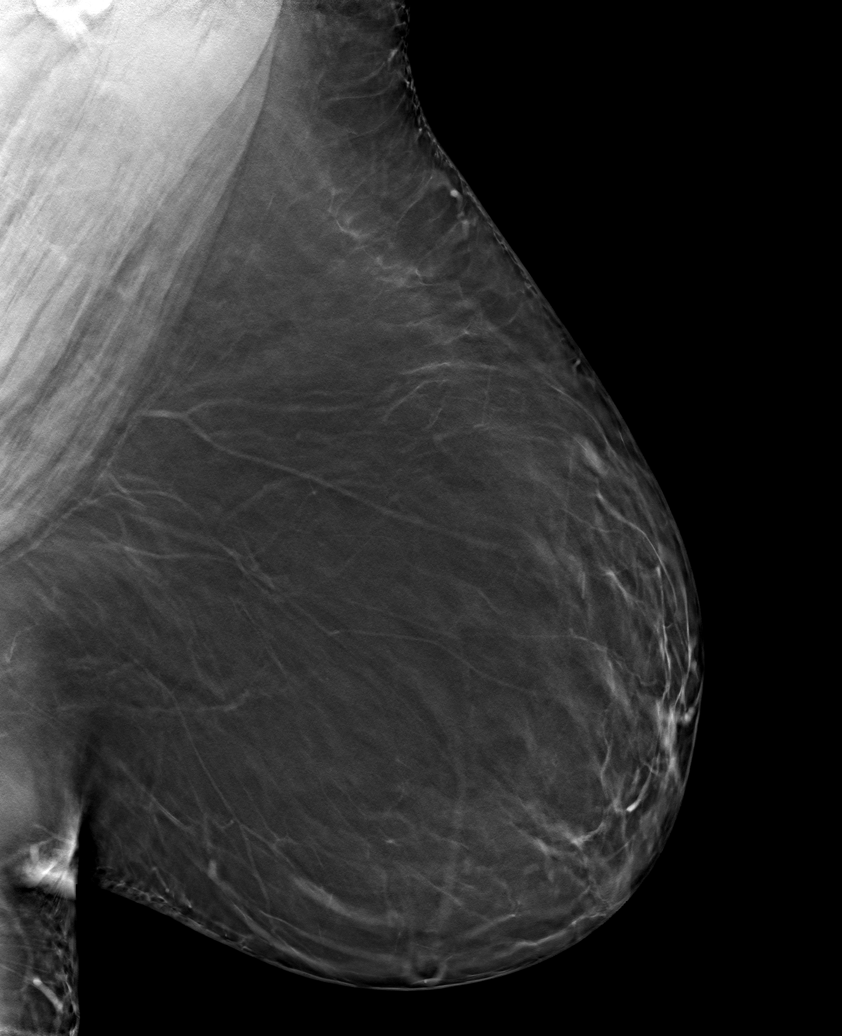

[6 of 30 positions shown; findings below may reference images not displayed]

FINDINGS: There are no findings suspicious for malignancy. Images were
processed with CAD.
IMPRESSION: No mammographic evidence of malignancy. A result letter of this
screening mammogram will be mailed directly to the patient.

RECOMMENDATION:
Screening mammogram in one year. (Code:[id number]-[id number])

BI-RADS CATEGORY  1: Negative.

## 2020-09-13 MED FILL — ATORVASTATIN CALCIUM 10 MG: 10 | 90 days supply | Qty: 90 | Fill #1

## 2020-09-17 MED FILL — WEGOVY 1.7 MG/0.75ML SOAJ: 1.7 | 28 days supply | Qty: 3 | Fill #0

## 2020-09-19 ENCOUNTER — Other Ambulatory Visit: Payer: Self-pay | Admitting: Nurse Practitioner

## 2020-09-19 ENCOUNTER — Telehealth: Payer: Self-pay

## 2020-09-19 DIAGNOSIS — M720 Palmar fascial fibromatosis [Dupuytren]: Secondary | ICD-10-CM | POA: Diagnosis not present

## 2020-09-19 DIAGNOSIS — Z6841 Body Mass Index (BMI) 40.0 and over, adult: Secondary | ICD-10-CM

## 2020-09-19 DIAGNOSIS — M7662 Achilles tendinitis, left leg: Secondary | ICD-10-CM | POA: Diagnosis not present

## 2020-09-19 DIAGNOSIS — M7661 Achilles tendinitis, right leg: Secondary | ICD-10-CM | POA: Diagnosis not present

## 2020-09-19 NOTE — Telephone Encounter (Signed)
LVM letting pt know she was approved for her medication [facility])

## 2020-09-21 ENCOUNTER — Other Ambulatory Visit: Payer: Self-pay | Admitting: Nurse Practitioner

## 2020-09-21 DIAGNOSIS — Z6841 Body Mass Index (BMI) 40.0 and over, adult: Secondary | ICD-10-CM

## 2020-09-21 DIAGNOSIS — E66813 Obesity, class 3: Secondary | ICD-10-CM

## 2020-09-21 MED FILL — WEGOVY 2.4 MG/0.75ML SOAJ: 2.4 | 28 days supply | Qty: 3 | Fill #0

## 2020-09-24 ENCOUNTER — Other Ambulatory Visit: Payer: Self-pay | Admitting: Nurse Practitioner

## 2020-09-24 DIAGNOSIS — Z6841 Body Mass Index (BMI) 40.0 and over, adult: Secondary | ICD-10-CM

## 2020-09-27 ENCOUNTER — Other Ambulatory Visit: Payer: Self-pay | Admitting: Nurse Practitioner

## 2020-09-27 DIAGNOSIS — Z6841 Body Mass Index (BMI) 40.0 and over, adult: Secondary | ICD-10-CM

## 2020-10-13 MED FILL — WEGOVY 2.4 MG/0.75ML SOAJ: 2.4 | 28 days supply | Qty: 3 | Fill #1

## 2020-10-24 ENCOUNTER — Ambulatory Visit: Payer: 59 | Admitting: Nurse Practitioner

## 2020-11-10 ENCOUNTER — Other Ambulatory Visit (HOSPITAL_COMMUNITY): Payer: Self-pay | Admitting: Nurse Practitioner

## 2020-11-10 ENCOUNTER — Other Ambulatory Visit: Payer: Self-pay | Admitting: Nurse Practitioner

## 2020-11-10 DIAGNOSIS — Z6841 Body Mass Index (BMI) 40.0 and over, adult: Secondary | ICD-10-CM

## 2020-11-17 ENCOUNTER — Other Ambulatory Visit (HOSPITAL_BASED_OUTPATIENT_CLINIC_OR_DEPARTMENT_OTHER): Payer: Self-pay

## 2020-11-23 MED FILL — MELOXICAM 15 MG TABLET: 15 | 90 days supply | Qty: 90 | Fill #3

## 2020-11-30 ENCOUNTER — Telehealth: Payer: Self-pay | Admitting: Podiatry

## 2020-11-30 NOTE — Telephone Encounter (Signed)
Pt sent online request about how often can she get orthotics.  I called pt and left message it has been awhile since she has seen Dr Logan Bores and if we are to bill insurance she would need an office visit to get an updated rx. Then we could just order another pair orthotics for her. As far as coverage I can never guarantee payment but usually her insurance covers one pair per yr at 80% after deductible. I asked pt to call to schedule an appt for the [place] office since that is where she was seen previously.

## 2021-01-12 ENCOUNTER — Other Ambulatory Visit (HOSPITAL_COMMUNITY): Payer: Self-pay

## 2021-01-12 ENCOUNTER — Encounter: Payer: Self-pay | Admitting: Nurse Practitioner

## 2021-01-12 ENCOUNTER — Other Ambulatory Visit: Payer: Self-pay

## 2021-01-12 ENCOUNTER — Ambulatory Visit (INDEPENDENT_AMBULATORY_CARE_PROVIDER_SITE_OTHER): Payer: 59 | Admitting: Nurse Practitioner

## 2021-01-12 VITALS — BP 124/80 | HR 86 | Temp 98.9°F | Ht 70.0 in | Wt 331.8 lb

## 2021-01-12 DIAGNOSIS — I10 Essential (primary) hypertension: Secondary | ICD-10-CM | POA: Diagnosis not present

## 2021-01-12 DIAGNOSIS — Z0000 Encounter for general adult medical examination without abnormal findings: Secondary | ICD-10-CM

## 2021-01-12 DIAGNOSIS — E782 Mixed hyperlipidemia: Secondary | ICD-10-CM

## 2021-01-12 DIAGNOSIS — R7303 Prediabetes: Secondary | ICD-10-CM

## 2021-01-12 DIAGNOSIS — Z1231 Encounter for screening mammogram for malignant neoplasm of breast: Secondary | ICD-10-CM

## 2021-01-12 DIAGNOSIS — Z6841 Body Mass Index (BMI) 40.0 and over, adult: Secondary | ICD-10-CM | POA: Diagnosis not present

## 2021-01-12 LAB — POCT UA - MICROALBUMIN
Albumin/Creatinine Ratio, Urine, POC: 30
Creatinine, POC: 300 mg/dL
Microalbumin Ur, POC: 10 mg/L

## 2021-01-12 LAB — POCT URINALYSIS DIPSTICK
Bilirubin, UA: NEGATIVE
Blood, UA: NEGATIVE
Glucose, UA: NEGATIVE
Ketones, UA: NEGATIVE
Leukocytes, UA: NEGATIVE
Nitrite, UA: NEGATIVE
Protein, UA: NEGATIVE
Spec Grav, UA: 1.025 (ref 1.010–1.025)
Urobilinogen, UA: 0.2 E.U./dL
pH, UA: 6 (ref 5.0–8.0)

## 2021-01-12 MED ORDER — SEMAGLUTIDE-WEIGHT MANAGEMENT 2.4 MG/0.75ML ~~LOC~~ SOAJ
SUBCUTANEOUS | 1 refills | Status: DC
  Filled 2021-01-12 – 2021-02-25 (×2): qty 3, 28d supply, fill #0
  Filled 2021-04-05 – 2021-06-07 (×2): qty 3, 28d supply, fill #1

## 2021-01-12 NOTE — Patient Instructions (Signed)
Health Maintenance, Female Adopting a healthy lifestyle and getting preventive care are important in promoting health and wellness. Ask your health care provider about:  The right schedule for you to have regular tests and exams.  Things you can do on your own to prevent diseases and keep yourself healthy. What should I know about diet, weight, and exercise? Eat a healthy diet  Eat a diet that includes plenty of vegetables, fruits, low-fat dairy products, and lean protein.  Do not eat a lot of foods that are high in solid fats, added sugars, or sodium.   Maintain a healthy weight Body mass index (BMI) is used to identify weight problems. It estimates body fat based on height and weight. Your health care provider can help determine your BMI and help you achieve or maintain a healthy weight. Get regular exercise Get regular exercise. This is one of the most important things you can do for your health. Most adults should:  Exercise for at least 150 minutes each week. The exercise should increase your heart rate and make you sweat (moderate-intensity exercise).  Do strengthening exercises at least twice a week. This is in addition to the moderate-intensity exercise.  Spend less time sitting. Even light physical activity can be beneficial. Watch cholesterol and blood lipids Have your blood tested for lipids and cholesterol at 41 years of age, then have this test every 5 years. Have your cholesterol levels checked more often if:  Your lipid or cholesterol levels are high.  You are older than 40 years of age.  You are at high risk for heart disease. What should I know about cancer screening? Depending on your health history and family history, you may need to have cancer screening at various ages. This may include screening for:  Breast cancer.  Cervical cancer.  Colorectal cancer.  Skin cancer.  Lung cancer. What should I know about heart disease, diabetes, and high blood  pressure? Blood pressure and heart disease  High blood pressure causes heart disease and increases the risk of stroke. This is more likely to develop in people who have high blood pressure readings, are of African descent, or are overweight.  Have your blood pressure checked: ? Every 3-5 years if you are 18-39 years of age. ? Every year if you are 40 years old or older. Diabetes Have regular diabetes screenings. This checks your fasting blood sugar level. Have the screening done:  Once every three years after age 40 if you are at a normal weight and have a low risk for diabetes.  More often and at a younger age if you are overweight or have a high risk for diabetes. What should I know about preventing infection? Hepatitis B If you have a higher risk for hepatitis B, you should be screened for this virus. Talk with your health care provider to find out if you are at risk for hepatitis B infection. Hepatitis C Testing is recommended for:  Everyone born from 1945 through 1965.  Anyone with known risk factors for hepatitis C. Sexually transmitted infections (STIs)  Get screened for STIs, including gonorrhea and chlamydia, if: ? You are sexually active and are younger than 41 years of age. ? You are older than 41 years of age and your health care provider tells you that you are at risk for this type of infection. ? Your sexual activity has changed since you were last screened, and you are at increased risk for chlamydia or gonorrhea. Ask your health care provider   if you are at risk.  Ask your health care provider about whether you are at high risk for HIV. Your health care provider may recommend a prescription medicine to help prevent HIV infection. If you choose to take medicine to prevent HIV, you should first get tested for HIV. You should then be tested every 3 months for as long as you are taking the medicine. Pregnancy  If you are about to stop having your period (premenopausal) and  you may become pregnant, seek counseling before you get pregnant.  Take 400 to 800 micrograms (mcg) of folic acid every day if you become pregnant.  Ask for birth control (contraception) if you want to prevent pregnancy. Osteoporosis and menopause Osteoporosis is a disease in which the bones lose minerals and strength with aging. This can result in bone fractures. If you are 65 years old or older, or if you are at risk for osteoporosis and fractures, ask your health care provider if you should:  Be screened for bone loss.  Take a calcium or vitamin D supplement to lower your risk of fractures.  Be given hormone replacement therapy (HRT) to treat symptoms of menopause. Follow these instructions at home: Lifestyle  Do not use any products that contain nicotine or tobacco, such as cigarettes, e-cigarettes, and chewing tobacco. If you need help quitting, ask your health care provider.  Do not use street drugs.  Do not share needles.  Ask your health care provider for help if you need support or information about quitting drugs. Alcohol use  Do not drink alcohol if: ? Your health care provider tells you not to drink. ? You are pregnant, may be pregnant, or are planning to become pregnant.  If you drink alcohol: ? Limit how much you use to 0-1 drink a day. ? Limit intake if you are breastfeeding.  Be aware of how much alcohol is in your drink. In the [place] one drink equals one 12 oz bottle of beer (355 mL), one 5 oz glass of wine (148 mL), or one 1 oz glass of hard liquor (44 mL). General instructions  Schedule regular health, dental, and eye exams.  Stay current with your vaccines.  Tell your health care provider if: ? You often feel depressed. ? You have ever been abused or do not feel safe at home. Summary  Adopting a healthy lifestyle and getting preventive care are important in promoting health and wellness.  Follow your health care provider's instructions about healthy  diet, exercising, and getting tested or screened for diseases.  Follow your health care provider's instructions on monitoring your cholesterol and blood pressure. This information is not intended to replace advice given to you by your health care provider. Make sure you discuss any questions you have with your health care provider. Document Revised: 08/06/2018 Document Reviewed: 08/06/2018 Elsevier Patient Education  2021 [employer]vier Inc.  

## 2021-01-12 NOTE — Progress Notes (Signed)
This visit occurred during the SARS-CoV-2 public health emergency.  Safety protocols were in place, including screening questions prior to the visit, additional usage of staff PPE, and extensive cleaning of exam room while observing appropriate contact time as indicated for disinfecting solutions.  Subjective:     Patient ID: Leslie Mendoza , female    DOB: 15-Apr-1981 , 40 y.o.   MRN: [id number]   Chief Complaint  Patient presents with  . Annual Exam    HPI  Here for HM. She has seen Dr. Garwin Brothers who referred her to an infertility specialist but she could never get in touch with the office so she "let it go".   She is also taking medications for weight loss. She does admit to being under increased stress and being an emotional eater.  She continues to have problems with her feet with pain which limits her from exercising.    Past Medical History:  Diagnosis Date  . Achilles tendinitis   . Hypertension      Family History  Problem Relation Age of Onset  . Cancer Mother   . Hypertension Mother   . Breast cancer Mother   . Heart disease Father   . Diabetes Father   . COPD Brother      Current Outpatient Medications:  .  amLODipine (NORVASC) 5 MG tablet, Take 1 tablet (5 mg total) by mouth daily., Disp: 90 tablet, Rfl: 1 .  atorvastatin (LIPITOR) 10 MG tablet, TAKE 1 TABLET BY MOUTH ONCE DAILY, Disp: 60 tablet, Rfl: 2 .  hydrochlorothiazide (MICROZIDE) 12.5 MG capsule, Take 1 capsule (12.5 mg total) by mouth daily., Disp: 90 capsule, Rfl: 1 .  meloxicam (MOBIC) 15 MG tablet, Take 1 tablet (15 mg total) by mouth daily., Disp: 90 tablet, Rfl: 3 .  Semaglutide-Weight Management 2.4 MG/0.75ML SOAJ, INJECT 2.4 MG INTO THE SKIN ONCE A WEEK., Disp: 3 mL, Rfl: 1   Allergies  Allergen Reactions  . Pineapple     hives      The patient states she uses none for birth control.  No LMP recorded. (Menstrual status: Other).. Negative for Dysmenorrhea and Negative for Menorrhagia.  Negative for: breast discharge, breast lump(s), breast pain and breast self exam. Associated symptoms include abnormal vaginal bleeding. Pertinent negatives include abnormal bleeding (hematology), anxiety, decreased libido, depression, difficulty falling sleep, dyspareunia, history of infertility, nocturia, sexual dysfunction, sleep disturbances, urinary incontinence, urinary urgency, vaginal discharge and vaginal itching. Diet regular. She continues to eat whatever she wants, she has been working more and was stressed. She admits to stress eating. She has tried to cut back on her sugar intake. Continues to take Wegovy 2.4 mg. The patient states her exercise level is minimal - she continues to be in pain with her feet by the end of her work day.   The patient's tobacco use is:  Social History   Tobacco Use  Smoking Status Current Every Day Smoker  . Packs/day: 0.50  . Years: 21.00  . Pack years: 10.50  . Types: Cigarettes  Smokeless Tobacco Never Used   She has been exposed to passive smoke. The patient's alcohol use is:  Social History   Substance and Sexual Activity  Alcohol Use No   Additional information: Last pap 01/05/2019, next one scheduled for 01/04/2022  Review of Systems  Constitutional: Negative.   HENT: Negative.   Eyes: Negative.   Respiratory: Negative.   Cardiovascular: Negative.  Negative for chest pain, palpitations and leg swelling.  Gastrointestinal: Negative.  Endocrine: Negative.   Genitourinary: Negative.   Musculoskeletal: Negative.   Skin: Negative.   Allergic/Immunologic: Negative.   Neurological: Negative.   Hematological: Negative.   Psychiatric/Behavioral: Negative.      Today's Vitals   01/12/21 0909  BP: 124/80  Pulse: 86  Temp: 98.9 F (37.2 C)  Weight: (!) 331 lb 12.8 oz (150.5 kg)  Height: 5' 10" (1.778 m)  PainSc: 5   PainLoc: Back   Body mass index is 47.61 kg/m.   Objective:  Physical Exam Vitals reviewed.  Constitutional:       General: She is not in acute distress.    Appearance: Normal appearance. She is well-developed. She is obese.  HENT:     Head: Normocephalic and atraumatic.     Right Ear: Hearing, tympanic membrane, ear canal and external ear normal. There is no impacted cerumen.     Left Ear: Hearing, tympanic membrane, ear canal and external ear normal. There is no impacted cerumen.     Nose:     Comments: Deferred - masked    Mouth/Throat:     Comments: Deferred - masked Eyes:     General: Lids are normal.     Extraocular Movements: Extraocular movements intact.     Conjunctiva/sclera: Conjunctivae normal.     Pupils: Pupils are equal, round, and reactive to light.     Funduscopic exam:    Right eye: No papilledema.        Left eye: No papilledema.  Neck:     Thyroid: No thyroid mass.     Vascular: No carotid bruit.  Cardiovascular:     Rate and Rhythm: Normal rate and regular rhythm.     Pulses: Normal pulses.     Heart sounds: Normal heart sounds. No murmur heard.   Pulmonary:     Effort: Pulmonary effort is normal.     Breath sounds: Normal breath sounds.  Chest:     Chest wall: No mass.  Breasts:     Tanner Score is 5.     Right: Normal. No mass, tenderness, axillary adenopathy or supraclavicular adenopathy.     Left: Normal. No mass, tenderness, axillary adenopathy or supraclavicular adenopathy.    Abdominal:     General: Abdomen is flat. Bowel sounds are normal. There is no distension.     Palpations: Abdomen is soft.     Tenderness: There is no abdominal tenderness.  Genitourinary:    Rectum: Guaiac result negative.  Musculoskeletal:        General: No swelling. Normal range of motion.     Cervical back: Full passive range of motion without pain, normal range of motion and neck supple.     Right lower leg: No edema.     Left lower leg: No edema.  Lymphadenopathy:     Upper Body:     Right upper body: No supraclavicular, axillary or pectoral adenopathy.     Left upper  body: No supraclavicular, axillary or pectoral adenopathy.  Skin:    General: Skin is warm and dry.     Capillary Refill: Capillary refill takes less than 2 seconds.  Neurological:     General: No focal deficit present.     Mental Status: She is alert and oriented to person, place, and time.     Cranial Nerves: No cranial nerve deficit.     Sensory: No sensory deficit.  Psychiatric:        Mood and Affect: Mood normal.  Behavior: Behavior normal.        Thought Content: Thought content normal.        Judgment: Judgment normal.         Assessment And Plan:     1. Encounter for general adult medical examination w/o abnormal findings . Behavior modifications discussed and diet history reviewed.   . Pt will continue to exercise regularly and modify diet with low GI, plant based foods and decrease intake of processed foods.  . Recommend intake of daily multivitamin, Vitamin D, and calcium.  . Recommend mammogram (ordered) for preventive screenings, as well as recommend immunizations that include influenza, TDAP - CBC  2. Prediabetes  Chronic, controlled  Continue with current medications  Encouraged to limit intake of sugary foods and drinks  Encouraged to increase physical activity to 150 minutes per week - CMP14+EGFR - Hemoglobin A1c  3. Essential hypertension . B/P is controlled.  . CMP ordered to check renal function.  . The importance of regular exercise and dietary modification was stressed to the patient.  . Stressed importance of losing ten percent of her body weight to help with B/P control.  . EKG done with sinus rhythm negative precordial t waves HR 78 - POCT Urinalysis Dipstick (81002) - POCT UA - Microalbumin - EKG 12-Lead - CMP14+EGFR  4. Mixed hyperlipidemia   Chronic, controlled  Continue with current medications, tolerating well  - CMP14+EGFR - Lipid panel  5. Class 3 severe obesity due to excess calories with serious comorbidity and body  mass index (BMI) of 45.0 to 49.9 in adult [facility])  Chronic  Discussed healthy diet and regular exercise options   Encouraged to exercise at least 150 minutes per week with 2 days of strength training  Unfortunately her weight has increased by 9 lbs since her last visit, she reports she continues to take [facility] but admits to being an emotional eater   I have encouraged her to consider alternative things to do when feeling hungry such as meditation, walking to keep from eating or even yoga Wt Readings from Last 3 Encounters:  01/12/21 (!) 331 lb 12.8 oz (150.5 kg)  08/24/20 (!) 322 lb 6.4 oz (146.2 kg)  07/12/20 (!) 329 lb 3.2 oz (149.3 kg)    - Hepatitis C antibody  6. Encounter for screening mammogram for malignant neoplasm of breast   Pt instructed on Self Breast Exam.According to ACOG guidelines Women aged 31 and older are recommended to get an annual mammogram. Form completed and given to patient contact the [facility] for appointment scheduing.   Pt encouraged to get annual mammogram - MM DIGITAL SCREENING BILATERAL; Future   Patient was given opportunity to ask questions. Patient verbalized understanding of the plan and was able to repeat key elements of the plan. All questions were answered to their satisfaction.   Minette Brine, FNP   I, Minette Brine, FNP, have reviewed all documentation for this visit. The documentation on 01/12/21 for the exam, diagnosis, procedures, and orders are all accurate and complete  THE PATIENT IS ENCOURAGED TO PRACTICE SOCIAL DISTANCING DUE TO THE COVID-19 PANDEMIC.

## 2021-01-13 ENCOUNTER — Other Ambulatory Visit (HOSPITAL_COMMUNITY): Payer: Self-pay

## 2021-01-13 LAB — LIPID PANEL
Chol/HDL Ratio: 2.1 ratio (ref 0.0–4.4)
Cholesterol, Total: 169 mg/dL (ref 100–199)
HDL: 80 mg/dL (ref >39)
LDL Chol Calc (NIH): 73 mg/dL (ref 0–99)
Triglycerides: 85 mg/dL (ref 0–149)
VLDL Cholesterol Cal: 16 mg/dL (ref 5–40)

## 2021-01-13 LAB — CMP14+EGFR
ALT: 28 IU/L (ref 0–32)
AST: 18 IU/L (ref 0–40)
Albumin/Globulin Ratio: 2 (ref 1.2–2.2)
Albumin: 4.7 g/dL (ref 3.8–4.8)
Alkaline Phosphatase: 62 IU/L (ref 44–121)
BUN/Creatinine Ratio: 19 (ref 9–23)
BUN: 18 mg/dL (ref 6–24)
Bilirubin Total: 0.4 mg/dL (ref 0.0–1.2)
CO2: 23 mmol/L (ref 20–29)
Calcium: 9.6 mg/dL (ref 8.7–10.2)
Chloride: 105 mmol/L (ref 96–106)
Creatinine, Ser: 0.93 mg/dL (ref 0.57–1.00)
Globulin, Total: 2.4 g/dL (ref 1.5–4.5)
Glucose: 85 mg/dL (ref 65–99)
Potassium: 4.2 mmol/L (ref 3.5–5.2)
Sodium: 143 mmol/L (ref 134–144)
Total Protein: 7.1 g/dL (ref 6.0–8.5)
eGFR: 79 mL/min/{1.73_m2} (ref >59)

## 2021-01-13 LAB — HEPATITIS C ANTIBODY: Hep C Virus Ab: <0.1 s/co ratio (ref 0.0–0.9)

## 2021-01-13 LAB — CBC
Hematocrit: 44.6 % (ref 34.0–46.6)
Hemoglobin: 14.7 g/dL (ref 11.1–15.9)
MCH: 28.5 pg (ref 26.6–33.0)
MCHC: 33 g/dL (ref 31.5–35.7)
MCV: 87 fL (ref 79–97)
Platelets: 199 10*3/uL (ref 150–450)
RBC: 5.15 x10E6/uL (ref 3.77–5.28)
RDW: 13 % (ref 11.7–15.4)
WBC: 4.9 10*3/uL (ref 3.4–10.8)

## 2021-01-13 LAB — HEMOGLOBIN A1C
Est. average glucose Bld gHb Est-mCnc: 117 mg/dL
Hgb A1c MFr Bld: 5.7 % — ABNORMAL HIGH (ref 4.8–5.6)

## 2021-01-24 ENCOUNTER — Other Ambulatory Visit (HOSPITAL_COMMUNITY): Payer: Self-pay

## 2021-02-25 ENCOUNTER — Other Ambulatory Visit (HOSPITAL_COMMUNITY): Payer: Self-pay

## 2021-03-01 ENCOUNTER — Other Ambulatory Visit (HOSPITAL_COMMUNITY): Payer: Self-pay

## 2021-03-01 ENCOUNTER — Other Ambulatory Visit: Payer: Self-pay | Admitting: Nurse Practitioner

## 2021-03-01 ENCOUNTER — Other Ambulatory Visit: Payer: Self-pay | Admitting: Podiatry

## 2021-03-01 DIAGNOSIS — I10 Essential (primary) hypertension: Secondary | ICD-10-CM

## 2021-03-01 DIAGNOSIS — E782 Mixed hyperlipidemia: Secondary | ICD-10-CM

## 2021-03-01 DIAGNOSIS — R7303 Prediabetes: Secondary | ICD-10-CM

## 2021-03-01 MED ORDER — AMLODIPINE BESYLATE 5 MG PO TABS
5.0000 mg | ORAL_TABLET | Freq: Every day | ORAL | 1 refills | Status: DC
  Filled 2021-03-01: qty 90, 90d supply, fill #0
  Filled 2021-04-05 – 2021-06-07 (×2): qty 90, 90d supply, fill #1

## 2021-03-01 MED ORDER — ATORVASTATIN CALCIUM 10 MG PO TABS
10.0000 mg | ORAL_TABLET | Freq: Every day | ORAL | 2 refills | Status: DC
  Filled 2021-03-01: qty 60, 60d supply, fill #0
  Filled 2021-04-05 – 2021-06-07 (×2): qty 60, 60d supply, fill #1
  Filled 2021-08-17: qty 60, 60d supply, fill #2

## 2021-03-02 ENCOUNTER — Other Ambulatory Visit (HOSPITAL_COMMUNITY): Payer: Self-pay

## 2021-03-07 ENCOUNTER — Other Ambulatory Visit (HOSPITAL_COMMUNITY): Payer: Self-pay

## 2021-03-07 ENCOUNTER — Encounter: Payer: Self-pay | Admitting: Podiatry

## 2021-03-07 ENCOUNTER — Ambulatory Visit: Payer: 59 | Admitting: Podiatry

## 2021-03-07 ENCOUNTER — Other Ambulatory Visit: Payer: Self-pay

## 2021-03-07 DIAGNOSIS — M7662 Achilles tendinitis, left leg: Secondary | ICD-10-CM

## 2021-03-07 DIAGNOSIS — M7661 Achilles tendinitis, right leg: Secondary | ICD-10-CM | POA: Diagnosis not present

## 2021-03-07 MED ORDER — MELOXICAM 15 MG PO TABS
15.0000 mg | ORAL_TABLET | Freq: Every day | ORAL | 3 refills | Status: DC
  Filled 2021-03-07 – 2021-04-05 (×2): qty 90, 90d supply, fill #0

## 2021-03-07 MED ORDER — MELOXICAM 15 MG PO TABS
15.0000 mg | ORAL_TABLET | Freq: Every day | ORAL | 0 refills | Status: DC
  Filled 2021-03-07: qty 30, 30d supply, fill #0

## 2021-03-07 NOTE — Progress Notes (Signed)
Subjective:  Patient ID: Leslie Mendoza, female    DOB: 09/27/1980,  MRN: [id number]  Chief Complaint  Patient presents with   Foot Pain    PT stated that she is still having the same pain as before and she needed a refill on medication     40 y.o. female presents with the above complaint.  Patient presents with still continuous pain on bilateral posterior Achilles tendon.  Patient states is painful to touch left is greater than right side.  Patient is known to Dr. Logan Bores who had done multiple injections as well as conservative management.  She has not gone to be placed in a boot.  She denies any other acute complaints.  She has already gone orthotics as well.  She would like to discuss treatment options for this.   Review of Systems: Negative except as noted in the HPI. Denies N/V/F/Ch.  Past Medical History:  Diagnosis Date   Achilles tendinitis    Hypertension     Current Outpatient Medications:    meloxicam (MOBIC) 15 MG tablet, Take 1 tablet  by mouth daily., Disp: 30 tablet, Rfl: 0   amLODipine (NORVASC) 5 MG tablet, Take 1 tablet (5 mg total) by mouth daily., Disp: 90 tablet, Rfl: 1   atorvastatin (LIPITOR) 10 MG tablet, TAKE 1 TABLET BY MOUTH ONCE DAILY, Disp: 60 tablet, Rfl: 2   hydrochlorothiazide (MICROZIDE) 12.5 MG capsule, Take 1 capsule (12.5 mg total) by mouth daily., Disp: 90 capsule, Rfl: 1   meloxicam (MOBIC) 15 MG tablet, Take 1 tablet (15 mg total) by mouth daily., Disp: 90 tablet, Rfl: 3   Semaglutide-Weight Management 2.4 MG/0.75ML SOAJ, INJECT 2.4 MG INTO THE SKIN ONCE A WEEK., Disp: 3 mL, Rfl: 1  Social History   Tobacco Use  Smoking Status Every Day   Packs/day: 0.50   Years: 21.00   Pack years: 10.50   Types: Cigarettes  Smokeless Tobacco Never    Allergies  Allergen Reactions   Pineapple     hives   Objective:  There were no vitals filed for this visit. There is no height or weight on file to calculate BMI. Constitutional Well  developed. Well nourished.  Vascular Dorsalis pedis pulses palpable bilaterally. Posterior tibial pulses palpable bilaterally. Capillary refill normal to all digits.  No cyanosis or clubbing noted. Pedal hair growth normal.  Neurologic Normal speech. Oriented to person, place, and time. Epicritic sensation to light touch grossly present bilaterally.  Dermatologic Nails well groomed and normal in appearance. No open wounds. No skin lesions.  Orthopedic: Pain on palpation of both Achilles tendon insertion.  Left greater than right side.  Positive Silfverskiold noted with gastrocnemius equinus.  Haglund's deformity noted bilaterally.  No pain at the posterior tibial tendon, peroneal tendon, ATFL ligament.  Pain with dorsiflexion of the ankle joint bilaterally.   Radiographs: NonePrior radiographs were reviewed of the left foot: Posterior heel spurring noted with Haglund's deformity identified. Assessment:   1. Achilles tendinitis, right leg   2. Achilles tendinitis, left leg    Plan:  Patient was evaluated and treated and all questions answered.  Bilateral Achilles tendinitis with underlying Haglund's deformity/gastrocnemius equinus left greater than right -I explained the patient the etiology of Achilles tendinitis and various treatment options were extensively discussed given the amount of pain that she is havi breathing ng I believe she will benefit from cam boot immobilization to the left side and Tri-Lock ankle brace to the right side.  I also believe she will  benefit from an MRI evaluation given that this has been going on for a long period of time. -Mobic was dispensed -Cam boot and Tri-Lock ankle brace were dispensed -Orthotics were evaluated seems to be functioning well  No follow-ups on file.

## 2021-03-15 ENCOUNTER — Encounter: Payer: Self-pay | Admitting: Nurse Practitioner

## 2021-03-15 ENCOUNTER — Ambulatory Visit
Admission: RE | Admit: 2021-03-15 | Discharge: 2021-03-15 | Disposition: A | Discharge status: Home / Self Care | Payer: 59 | Source: Ambulatory Visit | Attending: Nurse Practitioner | Admitting: Nurse Practitioner

## 2021-03-15 ENCOUNTER — Other Ambulatory Visit: Payer: Self-pay

## 2021-03-15 ENCOUNTER — Ambulatory Visit: Payer: 59 | Admitting: Nurse Practitioner

## 2021-03-15 VITALS — BP 116/82 | HR 82 | Temp 100.5°F | Ht 70.0 in | Wt 338.2 lb

## 2021-03-15 DIAGNOSIS — R002 Palpitations: Secondary | ICD-10-CM | POA: Diagnosis not present

## 2021-03-15 DIAGNOSIS — Z6841 Body Mass Index (BMI) 40.0 and over, adult: Secondary | ICD-10-CM

## 2021-03-15 DIAGNOSIS — E66813 Obesity, class 3: Secondary | ICD-10-CM

## 2021-03-15 DIAGNOSIS — R509 Fever, unspecified: Secondary | ICD-10-CM

## 2021-03-15 DIAGNOSIS — R5383 Other fatigue: Secondary | ICD-10-CM

## 2021-03-15 LAB — POCT URINALYSIS DIPSTICK
Bilirubin, UA: NEGATIVE
Blood, UA: NEGATIVE
Glucose, UA: NEGATIVE
Ketones, UA: NEGATIVE
Leukocytes, UA: NEGATIVE
Nitrite, UA: NEGATIVE
Protein, UA: NEGATIVE
Spec Grav, UA: 1.025 (ref 1.010–1.025)
Urobilinogen, UA: 0.2 E.U./dL
pH, UA: 6.5 (ref 5.0–8.0)

## 2021-03-15 NOTE — Progress Notes (Signed)
I,Katawbba Wiggins,acting as a Neurosurgeon for [employer], FNP.,have documented all relevant documentation on the behalf of [name], FNP,as directed by  [name], FNP while in the presence of [name], FNP.  This visit occurred during the SARS-CoV-2 public health emergency.  Safety protocols were in place, including screening questions prior to the visit, additional usage of staff PPE, and extensive cleaning of exam room while observing appropriate contact time as indicated for disinfecting solutions.  Subjective:     Patient ID: Leslie Mendoza , female    DOB: [birth year] , 40 y.o.   MRN: [id number]   Chief Complaint  Patient presents with   Obesity    HPI  Patient is here for a weight check.  Denies any cold symptoms, urinary issues or cough/congestion. She had covid at the beginning of June. She had a headache, cough, stuffy nose, and body aches. She lost her sense of taste and smell.   She had heart palpitations this morning, she will feel weak during these episodes. She does drink an increased amounts of tea. Her father had heart failure.   Wt Readings from Last 3 Encounters: 03/15/21 : (!) 338 lb 3.2 oz (153.4 kg) 01/12/21 : (!) 331 lb 12.8 oz (150.5 kg) 08/24/20 : (!) 322 lb 6.4 oz (146.2 kg)      Past Medical History:  Diagnosis Date   Achilles tendinitis    Hypertension      Family History  Problem Relation Age of Onset   Cancer Mother    Hypertension Mother    Breast cancer Mother    Heart disease Father    Diabetes Father    COPD Brother      Current Outpatient Medications:    amLODipine (NORVASC) 5 MG tablet, Take 1 tablet (5 mg total) by mouth daily., Disp: 90 tablet, Rfl: 1   atorvastatin (LIPITOR) 10 MG tablet, TAKE 1 TABLET BY MOUTH ONCE DAILY, Disp: 60 tablet, Rfl: 2   hydrochlorothiazide (MICROZIDE) 12.5 MG capsule, Take 1 capsule (12.5 mg total) by mouth daily., Disp: 90 capsule, Rfl: 1   meloxicam (MOBIC) 15 MG tablet, Take 1 tablet (15  mg total) by mouth daily., Disp: 90 tablet, Rfl: 3   Semaglutide-Weight Management 2.4 MG/0.75ML SOAJ, INJECT 2.4 MG INTO THE SKIN ONCE A WEEK., Disp: 3 mL, Rfl: 1   meloxicam (MOBIC) 15 MG tablet, Take 1 tablet  by mouth daily., Disp: 30 tablet, Rfl: 0   Allergies  Allergen Reactions   Pineapple     hives     Review of Systems  Constitutional: Negative.   Respiratory: Negative.    Cardiovascular: Negative.   Gastrointestinal: Negative.   Psychiatric/Behavioral: Negative.    All other systems reviewed and are negative.   Today's Vitals   03/15/21 0931  BP: 116/82  Pulse: 82  Temp: (!) 100.5 F (38.1 C)  TempSrc: Oral  Weight: (!) 338 lb 3.2 oz (153.4 kg)  Height: 5\' 10"  (1.778 m)   Body mass index is 48.53 kg/m.  Wt Readings from Last 3 Encounters:  03/15/21 (!) 338 lb 3.2 oz (153.4 kg)  01/12/21 (!) 331 lb 12.8 oz (150.5 kg)  08/24/20 (!) 322 lb 6.4 oz (146.2 kg)    BP Readings from Last 3 Encounters:  03/15/21 116/82  01/12/21 124/80  08/24/20 126/80    Objective:  Physical Exam Constitutional:      General: She is not in acute distress.    Appearance: Normal appearance. She is obese.  Cardiovascular:  Rate and Rhythm: Normal rate and regular rhythm.     Pulses: Normal pulses.     Heart sounds: Normal heart sounds. No murmur heard. Pulmonary:     Effort: Pulmonary effort is normal. No respiratory distress.     Breath sounds: Normal breath sounds. No wheezing.  Skin:    General: Skin is warm and dry.     Capillary Refill: Capillary refill takes less than 2 seconds.  Neurological:     General: No focal deficit present.     Mental Status: She is alert and oriented to person, place, and time.     Cranial Nerves: No cranial nerve deficit.  Psychiatric:        Mood and Affect: Mood normal.        Behavior: Behavior normal.        Thought Content: Thought content normal.        Judgment: Judgment normal.        Assessment And Plan:     1. Fever,  unspecified fever cause Unsure of cause, negative for covid  Will send for cxr and urinalysis is normal  2. Fatigue, unspecified type Will check for iron and thyroid Will refer to cardiology since having intermittent palpitations - TSH - Iron, TIBC and Ferritin Panel - Ambulatory referral to Cardiology - CBC with Differential/Platelet  3. Heart palpitations - TSH - Ambulatory referral to Cardiology  4. Class 3 severe obesity due to excess calories with serious comorbidity and body mass index (BMI) of 45.0 to 49.9 in adult [facility])  She is encouraged to strive for BMI less than 30 to decrease cardiac risk. Advised to aim for at least 150 minutes of exercise per week.   Patient was given opportunity to ask questions. Patient verbalized understanding of the plan and was able to repeat key elements of the plan. All questions were answered to their satisfaction.  [name], FNP   I, [name], FNP, have reviewed all documentation for this visit. The documentation on 03/15/21 for the exam, diagnosis, procedures, and orders are all accurate and complete.   IF YOU HAVE BEEN REFERRED TO A SPECIALIST, IT MAY TAKE 1-2 WEEKS TO SCHEDULE/PROCESS THE REFERRAL. IF YOU HAVE NOT HEARD FROM US/SPECIALIST IN TWO WEEKS, PLEASE GIVE [place] A CALL AT [phone].   THE PATIENT IS ENCOURAGED TO PRACTICE SOCIAL DISTANCING DUE TO THE COVID-19 PANDEMIC.

## 2021-03-15 NOTE — Patient Instructions (Signed)
Exercising to Lose Weight Exercise is structured, repetitive physical activity to improve fitness and health. Getting regular exercise is important for everyone. It is especially important if you are overweight. Being overweight increases your risk of heart disease, stroke, diabetes, high blood pressure, and several types of cancer.Reducing your calorie intake and exercising can help you lose weight. Exercise is usually categorized as moderate or vigorous intensity. To lose weight, most people need to do a certain amount of moderate-intensity orvigorous-intensity exercise each week. Moderate-intensity exercise  Moderate-intensity exercise is any activity that gets you moving enough to burn at least three times more energy (calories) than if you were sitting. Examples of moderate exercise include: Walking a mile in 15 minutes. Doing light yard work. Biking at an easy pace. Most people should get at least 150 minutes (2 hours and 30 minutes) a week ofmoderate-intensity exercise to maintain their body weight. Vigorous-intensity exercise Vigorous-intensity exercise is any activity that gets you moving enough to burn at least six times more calories than if you were sitting. When you exercise at this intensity, you should be working hard enough that you are not able tocarry on a conversation. Examples of vigorous exercise include: Running. Playing a team sport, such as football, basketball, and soccer. Jumping rope. Most people should get at least 75 minutes (1 hour and 15 minutes) a week ofvigorous-intensity exercise to maintain their body weight. How can exercise affect me? When you exercise enough to burn more calories than you eat, you lose weight. Exercise also reduces body fat and builds muscle. The more muscle you have, the more calories you burn. Exercise also: Improves mood. Reduces stress and tension. Improves your overall fitness, flexibility, and endurance. Increases bone strength. The  amount of exercise you need to lose weight depends on: Your age. The type of exercise. Any health conditions you have. Your overall physical ability. Talk to your health care provider about how much exercise you need and whattypes of activities are safe for you. What actions can I take to lose weight? Nutrition  Make changes to your diet as told by your health care provider or diet and nutrition specialist (dietitian). This may include: Eating fewer calories. Eating more protein. Eating less unhealthy fats. Eating a diet that includes fresh fruits and vegetables, whole grains, low-fat dairy products, and lean protein. Avoiding foods with added fat, salt, and sugar. Drink plenty of water while you exercise to prevent dehydration or heat stroke.  Activity Choose an activity that you enjoy and set realistic goals. Your health care provider can help you make an exercise plan that works for you. Exercise at a moderate or vigorous intensity most days of the week. The intensity of exercise may vary from person to person. You can tell how intense a workout is for you by paying attention to your breathing and heartbeat. Most people will notice their breathing and heartbeat get faster with more intense exercise. Do resistance training twice each week, such as: Push-ups. Sit-ups. Lifting weights. Using resistance bands. Getting short amounts of exercise can be just as helpful as long structured periods of exercise. If you have trouble finding time to exercise, try to include exercise in your daily routine. Get up, stretch, and walk around every 30 minutes throughout the day. Go for a walk during your lunch break. Park your car farther away from your destination. If you take public transportation, get off one stop early and walk the rest of the way. Make phone calls while standing up and   walking around. Take the stairs instead of elevators or escalators. Wear comfortable clothes and shoes with  good support. Do not exercise so much that you hurt yourself, feel dizzy, or get very short of breath. Where to find more information [place] Department of Health and Human Services: www.hhs.gov Centers for Disease Control and Prevention (CDC): www.cdc.gov Contact a health care provider: Before starting a new exercise program. If you have questions or concerns about your weight. If you have a medical problem that keeps you from exercising. Get help right away if you have any of the following while exercising: Injury. Dizziness. Difficulty breathing or shortness of breath that does not go away when you stop exercising. Chest pain. Rapid heartbeat. Summary Being overweight increases your risk of heart disease, stroke, diabetes, high blood pressure, and several types of cancer. Losing weight happens when you burn more calories than you eat. Reducing the amount of calories you eat in addition to getting regular moderate or vigorous exercise each week helps you lose weight. This information is not intended to replace advice given to you by your health care provider. Make sure you discuss any questions you have with your healthcare provider. Document Revised: 11/23/2019 Document Reviewed: 12/10/2019 Elsevier Patient Education  2022 Elsevier Inc.  

## 2021-03-16 LAB — CBC WITH DIFFERENTIAL/PLATELET
Basophils Absolute: 0 10*3/uL (ref 0.0–0.2)
Basos: 1 %
EOS (ABSOLUTE): 0.2 10*3/uL (ref 0.0–0.4)
Eos: 3 %
Hematocrit: 47.9 % — ABNORMAL HIGH (ref 34.0–46.6)
Hemoglobin: 15.6 g/dL (ref 11.1–15.9)
Immature Grans (Abs): 0 10*3/uL (ref 0.0–0.1)
Immature Granulocytes: 1 %
Lymphocytes Absolute: 3.4 10*3/uL — ABNORMAL HIGH (ref 0.7–3.1)
Lymphs: 51 %
MCH: 28.4 pg (ref 26.6–33.0)
MCHC: 32.6 g/dL (ref 31.5–35.7)
MCV: 87 fL (ref 79–97)
Monocytes Absolute: 0.7 10*3/uL (ref 0.1–0.9)
Monocytes: 11 %
Neutrophils Absolute: 2.1 10*3/uL (ref 1.4–7.0)
Neutrophils: 33 %
Platelets: 206 10*3/uL (ref 150–450)
RBC: 5.49 x10E6/uL — ABNORMAL HIGH (ref 3.77–5.28)
RDW: 13.1 % (ref 11.7–15.4)
WBC: 6.4 10*3/uL (ref 3.4–10.8)

## 2021-03-16 LAB — TSH: TSH: 0.898 u[IU]/mL (ref 0.450–4.500)

## 2021-03-16 LAB — IRON,TIBC AND FERRITIN PANEL
Ferritin: 80 ng/mL (ref 15–150)
Iron Saturation: 27 % (ref 15–55)
Iron: 90 ug/dL (ref 27–159)
Total Iron Binding Capacity: 332 ug/dL (ref 250–450)
UIBC: 242 ug/dL (ref 131–425)

## 2021-03-16 LAB — NOVEL CORONAVIRUS, NAA: SARS-CoV-2, NAA: NOT DETECTED

## 2021-03-16 LAB — SARS-COV-2, NAA 2 DAY TAT

## 2021-03-18 ENCOUNTER — Other Ambulatory Visit: Payer: Self-pay

## 2021-03-18 ENCOUNTER — Ambulatory Visit
Admission: RE | Admit: 2021-03-18 | Discharge: 2021-03-18 | Disposition: A | Discharge status: Home / Self Care | Payer: 59 | Source: Ambulatory Visit | Attending: Podiatry | Admitting: Podiatry

## 2021-03-18 DIAGNOSIS — S86012A Strain of left Achilles tendon, initial encounter: Secondary | ICD-10-CM | POA: Diagnosis not present

## 2021-03-18 DIAGNOSIS — M7662 Achilles tendinitis, left leg: Secondary | ICD-10-CM

## 2021-03-18 DIAGNOSIS — M7732 Calcaneal spur, left foot: Secondary | ICD-10-CM | POA: Diagnosis not present

## 2021-03-22 ENCOUNTER — Ambulatory Visit
Admission: RE | Admit: 2021-03-22 | Discharge: 2021-03-22 | Disposition: A | Discharge status: Home / Self Care | Payer: 59 | Source: Ambulatory Visit | Attending: Nurse Practitioner | Admitting: Nurse Practitioner

## 2021-03-22 ENCOUNTER — Other Ambulatory Visit: Payer: Self-pay

## 2021-03-22 DIAGNOSIS — Z1231 Encounter for screening mammogram for malignant neoplasm of breast: Secondary | ICD-10-CM | POA: Diagnosis not present

## 2021-04-05 ENCOUNTER — Other Ambulatory Visit (HOSPITAL_COMMUNITY): Payer: Self-pay

## 2021-04-06 ENCOUNTER — Ambulatory Visit: Payer: 59 | Admitting: Podiatry

## 2021-04-06 ENCOUNTER — Encounter: Payer: Self-pay | Admitting: Podiatry

## 2021-04-06 ENCOUNTER — Other Ambulatory Visit: Payer: Self-pay

## 2021-04-06 ENCOUNTER — Other Ambulatory Visit (HOSPITAL_COMMUNITY): Payer: Self-pay

## 2021-04-06 DIAGNOSIS — M7661 Achilles tendinitis, right leg: Secondary | ICD-10-CM

## 2021-04-06 DIAGNOSIS — M7662 Achilles tendinitis, left leg: Secondary | ICD-10-CM | POA: Diagnosis not present

## 2021-04-06 MED ORDER — MELOXICAM 15 MG PO TABS
15.0000 mg | ORAL_TABLET | Freq: Every day | ORAL | 3 refills | Status: DC
  Filled 2021-04-06: qty 90, 90d supply, fill #0
  Filled 2021-06-07 – 2021-09-24 (×2): qty 90, 90d supply, fill #1
  Filled 2022-01-23: qty 90, 90d supply, fill #2

## 2021-04-06 NOTE — Progress Notes (Signed)
Subjective:  Patient ID: Leslie Mendoza, female    DOB: [birth year],  MRN: [id number]  Chief Complaint  Patient presents with   Foot Pain    "No improvement, the right one is worse.  Pain goes up to my knee now."    40 y.o. female presents with the above complaint.  Patient presents with still continuous pain on bilateral posterior Achilles tendon.  Patient states is painful to touch left is greater than right side.  Patient is known to Dr. Logan Bores who had done multiple injections as well as conservative management.  She has not gone to be placed in a boot.  She denies any other acute complaints.  She has already gone orthotics as well.  She would like to discuss treatment options for this.   Review of Systems: Negative except as noted in the HPI. Denies N/V/F/Ch.  Past Medical History:  Diagnosis Date   Achilles tendinitis    Hypertension     Current Outpatient Medications:    amLODipine (NORVASC) 5 MG tablet, Take 1 tablet (5 mg total) by mouth daily., Disp: 90 tablet, Rfl: 1   atorvastatin (LIPITOR) 10 MG tablet, TAKE 1 TABLET BY MOUTH ONCE DAILY, Disp: 60 tablet, Rfl: 2   hydrochlorothiazide (MICROZIDE) 12.5 MG capsule, Take 1 capsule (12.5 mg total) by mouth daily., Disp: 90 capsule, Rfl: 1   meloxicam (MOBIC) 15 MG tablet, Take 1 tablet  by mouth daily., Disp: 30 tablet, Rfl: 0   meloxicam (MOBIC) 15 MG tablet, Take 1 tablet (15 mg total) by mouth daily., Disp: 90 tablet, Rfl: 3   Semaglutide-Weight Management 2.4 MG/0.75ML SOAJ, INJECT 2.4 MG INTO THE SKIN ONCE A WEEK., Disp: 3 mL, Rfl: 1  Social History   Tobacco Use  Smoking Status Every Day   Packs/day: 0.50   Years: 21.00   Pack years: 10.50   Types: Cigarettes  Smokeless Tobacco Never    Allergies  Allergen Reactions   Pineapple     hives   Objective:  There were no vitals filed for this visit. There is no height or weight on file to calculate BMI. Constitutional Well developed. Well nourished.  Vascular  Dorsalis pedis pulses palpable bilaterally. Posterior tibial pulses palpable bilaterally. Capillary refill normal to all digits.  No cyanosis or clubbing noted. Pedal hair growth normal.  Neurologic Normal speech. Oriented to person, place, and time. Epicritic sensation to light touch grossly present bilaterally.  Dermatologic Nails well groomed and normal in appearance. No open wounds. No skin lesions.  Orthopedic: Pain on palpation of both Achilles tendon insertion.  Left greater than right side.  Positive Silfverskiold noted with gastrocnemius equinus.  Haglund's deformity noted bilaterally.  No pain at the posterior tibial tendon, peroneal tendon, ATFL ligament.  Pain with dorsiflexion of the ankle joint bilaterally.   Radiographs: NonePrior radiographs were reviewed of the left foot: Posterior heel spurring noted with Haglund's deformity identified.  Insertional Achilles tendinopathy with a 1.1 cm transverse by approximately 0.4 cm AP partial tear of the tendon at its insertion. Mild reactive marrow edema and spurring at the Achilles tendon insertion also noted. The exam is otherwise negative   Assessment:   1. Achilles tendinitis, right leg   2. Achilles tendinitis, left leg     Plan:  Patient was evaluated and treated and all questions answered.  Bilateral Achilles tendinitis with underlying Haglund's deformity/gastrocnemius equinus left greater than right -I explained the patient the etiology of Achilles tendinitis and various treatment options were extensively discussed  given the amount of pain that she is havi breathing ng I believe she will benefit from cam boot immobilization to the left side and Tri-Lock ankle brace to the right side.  -MRI was discussed with the patient in extensive detail given the tearing that is present I believe patient will benefit from surgical intervention however patient would like to think about it.  I also discussed PRP injection as well.   Patient will think about the option and return to me when she is ready. -Mobic was dispensed -Cam boot and Tri-Lock ankle brace were dispensed -Orthotics were evaluated seems to be functioning well  No follow-ups on file.

## 2021-04-06 NOTE — Progress Notes (Signed)
Mobic

## 2021-04-07 ENCOUNTER — Other Ambulatory Visit (HOSPITAL_COMMUNITY): Payer: Self-pay

## 2021-04-07 ENCOUNTER — Encounter: Payer: Self-pay | Admitting: Podiatry

## 2021-04-12 ENCOUNTER — Other Ambulatory Visit (HOSPITAL_COMMUNITY): Payer: Self-pay

## 2021-04-13 ENCOUNTER — Other Ambulatory Visit: Payer: Self-pay

## 2021-04-13 ENCOUNTER — Other Ambulatory Visit (HOSPITAL_COMMUNITY): Payer: Self-pay

## 2021-04-13 ENCOUNTER — Encounter: Payer: Self-pay | Admitting: Nurse Practitioner

## 2021-04-13 DIAGNOSIS — I10 Essential (primary) hypertension: Secondary | ICD-10-CM

## 2021-04-13 MED ORDER — HYDROCHLOROTHIAZIDE 12.5 MG PO CAPS
12.5000 mg | ORAL_CAPSULE | Freq: Every day | ORAL | 1 refills | Status: DC
Start: 2021-04-13 — End: 2021-08-29
  Filled 2021-04-13: qty 90, 90d supply, fill #0
  Filled 2021-06-07 – 2021-07-12 (×2): qty 90, 90d supply, fill #1

## 2021-04-14 ENCOUNTER — Other Ambulatory Visit (HOSPITAL_COMMUNITY): Payer: Self-pay

## 2021-04-18 ENCOUNTER — Other Ambulatory Visit (HOSPITAL_COMMUNITY): Payer: Self-pay

## 2021-04-21 ENCOUNTER — Other Ambulatory Visit (HOSPITAL_COMMUNITY): Payer: Self-pay

## 2021-04-26 ENCOUNTER — Other Ambulatory Visit (HOSPITAL_COMMUNITY): Payer: Self-pay

## 2021-04-28 ENCOUNTER — Other Ambulatory Visit (HOSPITAL_COMMUNITY): Payer: Self-pay

## 2021-05-02 ENCOUNTER — Other Ambulatory Visit (HOSPITAL_COMMUNITY): Payer: Self-pay

## 2021-05-04 ENCOUNTER — Other Ambulatory Visit (HOSPITAL_COMMUNITY): Payer: Self-pay

## 2021-05-05 ENCOUNTER — Other Ambulatory Visit (HOSPITAL_COMMUNITY): Payer: Self-pay

## 2021-05-08 ENCOUNTER — Other Ambulatory Visit (HOSPITAL_COMMUNITY): Payer: Self-pay

## 2021-05-18 ENCOUNTER — Ambulatory Visit: Payer: 59 | Admitting: Podiatry

## 2021-05-18 ENCOUNTER — Other Ambulatory Visit: Payer: Self-pay

## 2021-05-18 ENCOUNTER — Encounter: Payer: Self-pay | Admitting: Podiatry

## 2021-05-18 DIAGNOSIS — M7661 Achilles tendinitis, right leg: Secondary | ICD-10-CM | POA: Diagnosis not present

## 2021-05-18 DIAGNOSIS — M7662 Achilles tendinitis, left leg: Secondary | ICD-10-CM

## 2021-05-18 NOTE — Progress Notes (Signed)
Subjective:  Patient ID: Leslie Mendoza, female    DOB: [birth year],  MRN: [id number]  Chief Complaint  Patient presents with   Foot Pain    "No changes but I'm walking."    40 y.o. female presents with the above complaint.  Patient presents with still continuous pain on bilateral posterior Achilles tendon.  Patient states is painful to touch left is greater than right side.  Patient is known to Dr. Logan Bores who had done multiple injections as well as conservative management.  She is now interested in doing PRP injection.  She would like to hold off on surgery for now.   Review of Systems: Negative except as noted in the HPI. Denies N/V/F/Ch.  Past Medical History:  Diagnosis Date   Achilles tendinitis    Hypertension     Current Outpatient Medications:    amLODipine (NORVASC) 5 MG tablet, Take 1 tablet (5 mg total) by mouth daily., Disp: 90 tablet, Rfl: 1   atorvastatin (LIPITOR) 10 MG tablet, TAKE 1 TABLET BY MOUTH ONCE DAILY, Disp: 60 tablet, Rfl: 2   hydrochlorothiazide (MICROZIDE) 12.5 MG capsule, Take 1 capsule by mouth daily., Disp: 90 capsule, Rfl: 1   meloxicam (MOBIC) 15 MG tablet, Take 1 tablet  by mouth daily., Disp: 30 tablet, Rfl: 0   meloxicam (MOBIC) 15 MG tablet, Take 1 tablet (15 mg total) by mouth daily., Disp: 90 tablet, Rfl: 3   Semaglutide-Weight Management 2.4 MG/0.75ML SOAJ, INJECT 2.4 MG INTO THE SKIN ONCE A WEEK., Disp: 3 mL, Rfl: 1  Social History   Tobacco Use  Smoking Status Every Day   Packs/day: 0.50   Years: 21.00   Pack years: 10.50   Types: Cigarettes  Smokeless Tobacco Never    Allergies  Allergen Reactions   Pineapple     hives   Objective:  There were no vitals filed for this visit. There is no height or weight on file to calculate BMI. Constitutional Well developed. Well nourished.  Vascular Dorsalis pedis pulses palpable bilaterally. Posterior tibial pulses palpable bilaterally. Capillary refill normal to all digits.  No  cyanosis or clubbing noted. Pedal hair growth normal.  Neurologic Normal speech. Oriented to person, place, and time. Epicritic sensation to light touch grossly present bilaterally.  Dermatologic Nails well groomed and normal in appearance. No open wounds. No skin lesions.  Orthopedic: Pain on palpation of both Achilles tendon insertion.  Left greater than right side.  Positive Silfverskiold noted with gastrocnemius equinus.  Haglund's deformity noted bilaterally.  No pain at the posterior tibial tendon, peroneal tendon, ATFL ligament.  Pain with dorsiflexion of the ankle joint bilaterally.   Radiographs: NonePrior radiographs were reviewed of the left foot: Posterior heel spurring noted with Haglund's deformity identified.  Insertional Achilles tendinopathy with a 1.1 cm transverse by approximately 0.4 cm AP partial tear of the tendon at its insertion. Mild reactive marrow edema and spurring at the Achilles tendon insertion also noted. The exam is otherwise negative   Assessment:   1. Achilles tendinitis, right leg   2. Achilles tendinitis, left leg      Plan:  Patient was evaluated and treated and all questions answered.  Bilateral Achilles tendinitis with underlying Haglund's deformity/gastrocnemius equinus left greater than right -I explained the patient the etiology of Achilles tendinitis and various treatment options were extensively discussed given the amount of pain that she is havi breathing ng I believe she will benefit from cam boot immobilization to the left side and Tri-Lock ankle brace  to the right side.  -MRI was discussed with the patient in extensive detail given the tearing that is present I believe patient will benefit from surgical intervention however patient would like to think about it.  -She will be scheduled to get a PRP injection done to bilateral heel with 1 injection -Mobic was dispensed -Cam boot and Tri-Lock ankle brace were dispensed -Orthotics were  evaluated seems to be functioning well  No follow-ups on file.

## 2021-05-31 ENCOUNTER — Ambulatory Visit: Payer: 59 | Admitting: Nurse Practitioner

## 2021-06-02 ENCOUNTER — Other Ambulatory Visit: Payer: 59

## 2021-06-07 ENCOUNTER — Other Ambulatory Visit (HOSPITAL_COMMUNITY): Payer: Self-pay

## 2021-06-09 ENCOUNTER — Other Ambulatory Visit: Payer: 59

## 2021-06-09 ENCOUNTER — Other Ambulatory Visit (HOSPITAL_COMMUNITY): Payer: Self-pay

## 2021-06-13 ENCOUNTER — Other Ambulatory Visit (HOSPITAL_COMMUNITY): Payer: Self-pay

## 2021-06-14 ENCOUNTER — Other Ambulatory Visit (HOSPITAL_COMMUNITY): Payer: Self-pay

## 2021-06-16 ENCOUNTER — Ambulatory Visit: Payer: 59 | Admitting: Cardiology

## 2021-06-16 ENCOUNTER — Encounter: Payer: Self-pay | Admitting: Cardiology

## 2021-06-16 ENCOUNTER — Other Ambulatory Visit: Payer: Self-pay

## 2021-06-16 ENCOUNTER — Ambulatory Visit (INDEPENDENT_AMBULATORY_CARE_PROVIDER_SITE_OTHER): Payer: 59

## 2021-06-16 ENCOUNTER — Ambulatory Visit (INDEPENDENT_AMBULATORY_CARE_PROVIDER_SITE_OTHER): Payer: 59 | Admitting: Podiatry

## 2021-06-16 VITALS — BP 130/86 | HR 73 | Ht 70.5 in | Wt 355.0 lb

## 2021-06-16 DIAGNOSIS — M7661 Achilles tendinitis, right leg: Secondary | ICD-10-CM

## 2021-06-16 DIAGNOSIS — E7800 Pure hypercholesterolemia, unspecified: Secondary | ICD-10-CM | POA: Diagnosis not present

## 2021-06-16 DIAGNOSIS — I10 Essential (primary) hypertension: Secondary | ICD-10-CM

## 2021-06-16 DIAGNOSIS — R002 Palpitations: Secondary | ICD-10-CM

## 2021-06-16 DIAGNOSIS — M7662 Achilles tendinitis, left leg: Secondary | ICD-10-CM

## 2021-06-16 DIAGNOSIS — F17200 Nicotine dependence, unspecified, uncomplicated: Secondary | ICD-10-CM | POA: Diagnosis not present

## 2021-06-16 NOTE — Patient Instructions (Signed)
Medication Instructions:  Your physician recommends that you continue on your current medications as directed. Please refer to the Current Medication list given to you today.  *If you need a refill on your cardiac medications before your next appointment, please call your pharmacy*   Lab Work: None ordered If you have labs (blood work) drawn today and your tests are completely normal, you will receive your results only by: MyChart Message (if you have MyChart) OR A paper copy in the mail If you have any lab test that is abnormal or we need to change your treatment, we will call you to review the results.   Testing/Procedures: Your physician has recommended that you wear a Zio XT monitor. (To be worn for 14 days)  This monitor is a medical device that records the heart's electrical activity. Doctors most often use these monitors to diagnose arrhythmias. Arrhythmias are problems with the speed or rhythm of the heartbeat. The monitor is a small device applied to your chest. You can wear one while you do your normal daily activities. While wearing this monitor if you have any symptoms to push the button and record what you felt. Once you have worn this monitor for the period of time provider prescribed (Usually 14 days), you will return the monitor device in the postage paid box. Once it is returned they will download the data collected and provide [place] with a report which the provider will then review and we will call you with those results. Important tips:  Avoid showering during the first 24 hours of wearing the monitor. Avoid excessive sweating to help maximize wear time. Do not submerge the device, no hot tubs, and no swimming pools. Keep any lotions or oils away from the patch. After 24 hours you may shower with the patch on. Take brief showers with your back facing the shower head.  Do not remove patch once it has been placed because that will interrupt data and decrease adhesive wear  time. Push the button when you have any symptoms and write down what you were feeling. Once you have completed wearing your monitor, remove and place into box which has postage paid and place in your outgoing mailbox.  If for some reason you have misplaced your box then call our office and we can provide another box and/or mail it off for you.      Follow-Up: At [facility], you and your health needs are our priority.  As part of our continuing mission to provide you with exceptional heart care, we have created designated Provider Care Teams.  These Care Teams include your primary Cardiologist (physician) and Advanced Practice Providers (APPs -  Physician Assistants and Nurse Practitioners) who all work together to provide you with the care you need, when you need it.  We recommend signing up for the patient portal called "MyChart".  Sign up information is provided on this After Visit Summary.  MyChart is used to connect with patients for Virtual Visits (Telemedicine).  Patients are able to view lab/test results, encounter notes, upcoming appointments, etc.  Non-urgent messages can be sent to your provider as well.   To learn more about what you can do with MyChart, go to [email or web address].    Your next appointment:   6-8 week(s)  The format for your next appointment:   In Person  Provider:   You may see Debbe Odea, MD or one of the following Advanced Practice Providers on your designated Care Team:   Nicolasa Ducking,  NP Eula Listen, PA-C Marisue Ivan, PA-C Cadence Fransico Michael, [place]   Other Instructions N/A

## 2021-06-16 NOTE — Progress Notes (Signed)
Cardiology Office Note:    Date:  06/16/2021   ID:  Leslie Mendoza, DOB Apr 25, 1981, MRN [id number]  PCP:  [name], FNP   [facility] Providers Cardiologist:  Debbe Odea, MD     Referring MD: [name], FNP   Chief Complaint  Patient presents with   NEW patient-Referred by PCP for eval of palpitations    Palpitations have not recurred since she saw her PCP in May. Patient also reports always feeling fatigued.   Leslie Mendoza is a 40 y.o. female who is being seen today for the evaluation of palpitations at the request of [name], FNP.   History of Present Illness:    Leslie Mendoza is a 40 y.o. female with a hx of hypertension, hyperlipidemia, current smoker x20+ years who presents due to palpitations.  Patient states having symptoms of palpitations ongoing for over a year now.  Symptoms alcohol infrequently, quit alcohol once a month or twice a month.  Symptoms usually associated with fatigue and dizziness, sometimes may last up to an hour.  She denies chest pain or shortness of breath at rest or with exertion.  Has had Achilles tendinitis which limits her movements.  Otherwise she is fine.  Currently smokes, is working on quitting.  Also is working on losing some weight.  States father has a history of heart failure.  Past Medical History:  Diagnosis Date   Achilles tendinitis    Hypertension     Past Surgical History:  Procedure Laterality Date   CHOLECYSTECTOMY      Current Medications: Current Meds  Medication Sig   amLODipine (NORVASC) 5 MG tablet Take 1 tablet (5 mg total) by mouth daily.   atorvastatin (LIPITOR) 10 MG tablet TAKE 1 TABLET BY MOUTH ONCE DAILY   hydrochlorothiazide (MICROZIDE) 12.5 MG capsule Take 1 capsule by mouth daily.   meloxicam (MOBIC) 15 MG tablet Take 1 tablet (15 mg total) by mouth daily.     Allergies:   Aspirin and Pineapple   Social History   Socioeconomic History   Marital status: Single    Spouse  name: Not on file   Number of children: Not on file   Years of education: Not on file   Highest education level: Not on file  Occupational History   Not on file  Tobacco Use   Smoking status: Every Day    Packs/day: 0.50    Years: 21.00    Pack years: 10.50    Types: Cigarettes   Smokeless tobacco: Never  Vaping Use   Vaping Use: Former   Devices: years ago, used it 1 time  Substance and Sexual Activity   Alcohol use: No   Drug use: No   Sexual activity: Yes  Other Topics Concern   Not on file  Social History Narrative   Not on file   Social Determinants of Health   Financial Resource Strain: Not on file  Food Insecurity: Not on file  Transportation Needs: Not on file  Physical Activity: Not on file  Stress: Not on file  Social Connections: Not on file     Family History: The patient's family history includes Breast cancer in her mother; Cancer in her mother; Diabetes in her father; Heart disease in her father; Hypertension in her mother.  ROS:   Please see the history of present illness.     All other systems reviewed and are negative.  EKGs/Labs/Other Studies Reviewed:    The following studies were reviewed today:  EKG:  EKG is  ordered today.  The ekg ordered today demonstrates normal sinus rhythm, cannot rule out anterior infarct  Recent Labs: 01/12/2021: ALT 28; BUN 18; Creatinine, Ser 0.93; Potassium 4.2; Sodium 143 03/15/2021: Hemoglobin 15.6; Platelets 206; TSH 0.898  Recent Lipid Panel    Component Value Date/Time   CHOL 169 01/12/2021 1042   TRIG 85 01/12/2021 1042   HDL 80 01/12/2021 1042   CHOLHDL 2.1 01/12/2021 1042   LDLCALC 73 01/12/2021 1042     Risk Assessment/Calculations:          Physical Exam:    VS:  BP 130/86 (BP Location: Right Arm, Patient Position: Sitting, Cuff Size: Large)   Pulse 73   Ht 5' 10.5" (1.791 m)   Wt (!) 355 lb (161 kg)   SpO2 98%   BMI 50.22 kg/m     Wt Readings from Last 3 Encounters:  06/16/21 (!)  355 lb (161 kg)  03/15/21 (!) 338 lb 3.2 oz (153.4 kg)  01/12/21 (!) 331 lb 12.8 oz (150.5 kg)     GEN:  Well nourished, well developed in no acute distress HEENT: Normal NECK: No JVD; No carotid bruits LYMPHATICS: No lymphadenopathy CARDIAC: RRR, no murmurs, rubs, gallops RESPIRATORY:  Clear to auscultation without rales, wheezing or rhonchi  ABDOMEN: Soft, non-tender, non-distended MUSCULOSKELETAL:  No edema; No deformity  SKIN: Warm and dry NEUROLOGIC:  Alert and oriented x 3 PSYCHIATRIC:  Normal affect   ASSESSMENT:    1. Palpitations   2. Primary hypertension   3. Pure hypercholesterolemia   4. Morbid obesity (HCC)   5. Smoking    PLAN:    In order of problems listed above:  Palpitations, place 2-week cardiac monitor to evaluate any significant arrhythmias. Hypertension, BP controlled, continue HCTZ, amlodipine. Hyperlipidemia, cholesterol controlled.  Continue Lipitor 10 mg daily. Low-calorie diet, weight loss advised. Current smoker, smoking cessation advised.  Follow-up after cardiac monitor.     Medication Adjustments/Labs and Tests Ordered: Current medicines are reviewed at length with the patient today.  Concerns regarding medicines are outlined above.  Orders Placed This Encounter  Procedures   LONG TERM MONITOR (3-14 DAYS)   EKG 12-Lead    No orders of the defined types were placed in this encounter.   Patient Instructions  Medication Instructions:  Your physician recommends that you continue on your current medications as directed. Please refer to the Current Medication list given to you today.  *If you need a refill on your cardiac medications before your next appointment, please call your pharmacy*   Lab Work: None ordered If you have labs (blood work) drawn today and your tests are completely normal, you will receive your results only by: MyChart Message (if you have MyChart) OR A paper copy in the mail If you have any lab test that is  abnormal or we need to change your treatment, we will call you to review the results.   Testing/Procedures: Your physician has recommended that you wear a Zio XT monitor. (To be worn for 14 days)  This monitor is a medical device that records the heart's electrical activity. Doctors most often use these monitors to diagnose arrhythmias. Arrhythmias are problems with the speed or rhythm of the heartbeat. The monitor is a small device applied to your chest. You can wear one while you do your normal daily activities. While wearing this monitor if you have any symptoms to push the button and record what you felt. Once you have worn this  monitor for the period of time provider prescribed (Usually 14 days), you will return the monitor device in the postage paid box. Once it is returned they will download the data collected and provide [place] with a report which the provider will then review and we will call you with those results. Important tips:  Avoid showering during the first 24 hours of wearing the monitor. Avoid excessive sweating to help maximize wear time. Do not submerge the device, no hot tubs, and no swimming pools. Keep any lotions or oils away from the patch. After 24 hours you may shower with the patch on. Take brief showers with your back facing the shower head.  Do not remove patch once it has been placed because that will interrupt data and decrease adhesive wear time. Push the button when you have any symptoms and write down what you were feeling. Once you have completed wearing your monitor, remove and place into box which has postage paid and place in your outgoing mailbox.  If for some reason you have misplaced your box then call our office and we can provide another box and/or mail it off for you.      Follow-Up: At [facility], you and your health needs are our priority.  As part of our continuing mission to provide you with exceptional heart care, we have created designated  Provider Care Teams.  These Care Teams include your primary Cardiologist (physician) and Advanced Practice Providers (APPs -  Physician Assistants and Nurse Practitioners) who all work together to provide you with the care you need, when you need it.  We recommend signing up for the patient portal called "MyChart".  Sign up information is provided on this After Visit Summary.  MyChart is used to connect with patients for Virtual Visits (Telemedicine).  Patients are able to view lab/test results, encounter notes, upcoming appointments, etc.  Non-urgent messages can be sent to your provider as well.   To learn more about what you can do with MyChart, go to [email or web address].    Your next appointment:   6-8 week(s)  The format for your next appointment:   In Person  Provider:   You may see Debbe Odea, MD or one of the following Advanced Practice Providers on your designated Care Team:   Nicolasa Ducking, NP Eula Listen, PA-C Marisue Ivan, PA-C Cadence [place], [place]   Other Instructions N/A   Signed, Debbe Odea, MD  06/16/2021 12:48 PM    [facility]

## 2021-06-21 NOTE — Progress Notes (Signed)
PRP injection was delivered in standard technique to bilateral Achilles Kager's fat pad.  No complication noted.  4 cc of PRP injection to in each Achilles tendon.

## 2021-06-26 ENCOUNTER — Encounter: Payer: 59 | Admitting: Nurse Practitioner

## 2021-06-26 NOTE — Progress Notes (Deleted)
  Jeri Cos Llittleton,acting as a Neurosurgeon for [name], FNP.,have documented all relevant documentation on the behalf of [name], FNP,as directed by  [name], FNP while in the presence of [name], FNP.  This visit occurred during the SARS-CoV-2 public health emergency.  Safety protocols were in place, including screening questions prior to the visit, additional usage of staff PPE, and extensive cleaning of exam room while observing appropriate contact time as indicated for disinfecting solutions.  Subjective:     Patient ID: Leslie Mendoza , female    DOB: 1980-10-31 , 40 y.o.   MRN: [id number]   Chief Complaint  Patient presents with   Weight Check    HPI  Patient is here for a weight check and f/u on her bp and prediabetes.    Past Medical History:  Diagnosis Date   Achilles tendinitis    Hypertension      Family History  Problem Relation Age of Onset   Cancer Mother    Hypertension Mother    Breast cancer Mother    Heart disease Father    Diabetes Father      Current Outpatient Medications:    amLODipine (NORVASC) 5 MG tablet, Take 1 tablet (5 mg total) by mouth daily., Disp: 90 tablet, Rfl: 1   atorvastatin (LIPITOR) 10 MG tablet, TAKE 1 TABLET BY MOUTH ONCE DAILY, Disp: 60 tablet, Rfl: 2   hydrochlorothiazide (MICROZIDE) 12.5 MG capsule, Take 1 capsule by mouth daily., Disp: 90 capsule, Rfl: 1   meloxicam (MOBIC) 15 MG tablet, Take 1 tablet (15 mg total) by mouth daily., Disp: 90 tablet, Rfl: 3   Allergies  Allergen Reactions   Aspirin     rash   Pineapple     hives     Review of Systems  Constitutional: Negative.   Respiratory: Negative.    Cardiovascular: Negative.   Neurological: Negative.   Psychiatric/Behavioral: Negative.      There were no vitals filed for this visit. There is no height or weight on file to calculate BMI.   Objective:  Physical Exam      Assessment And Plan:     1. Essential hypertension  2.  Prediabetes     Patient was given opportunity to ask questions. Patient verbalized understanding of the plan and was able to repeat key elements of the plan. All questions were answered to their satisfaction.  Patrecia Pour Llittleton, CMA   I, Patrecia Pour Llittleton, CMA, have reviewed all documentation for this visit. The documentation on 06/26/21 for the exam, diagnosis, procedures, and orders are all accurate and complete.   IF YOU HAVE BEEN REFERRED TO A SPECIALIST, IT MAY TAKE 1-2 WEEKS TO SCHEDULE/PROCESS THE REFERRAL. IF YOU HAVE NOT HEARD FROM US/SPECIALIST IN TWO WEEKS, PLEASE GIVE [place] A CALL AT [phone].   THE PATIENT IS ENCOURAGED TO PRACTICE SOCIAL DISTANCING DUE TO THE COVID-19 PANDEMIC.

## 2021-07-10 DIAGNOSIS — R002 Palpitations: Secondary | ICD-10-CM | POA: Diagnosis not present

## 2021-07-12 ENCOUNTER — Other Ambulatory Visit (HOSPITAL_COMMUNITY): Payer: Self-pay

## 2021-07-13 ENCOUNTER — Ambulatory Visit: Payer: 59 | Admitting: Podiatry

## 2021-07-13 ENCOUNTER — Other Ambulatory Visit: Payer: Self-pay

## 2021-07-13 DIAGNOSIS — M7661 Achilles tendinitis, right leg: Secondary | ICD-10-CM

## 2021-07-13 DIAGNOSIS — M7662 Achilles tendinitis, left leg: Secondary | ICD-10-CM

## 2021-07-13 NOTE — Progress Notes (Signed)
Subjective:  Patient ID: Leslie Mendoza, female    DOB: Nov 22, 1980,  MRN: [id number]  Chief Complaint  Patient presents with   Injections    POST PRP injections PT stated that the shots helped a lot     40 y.o. female presents with the above complaint.  Patient presents with follow-up of bilateral posterior Achilles tendon.  She had a PRP injection done for which she has about 70 to 80% relief.  She still has some residual pain.  She would like to know if she can do a second round of PRP injection.   Review of Systems: Negative except as noted in the HPI. Denies N/V/F/Ch.  Past Medical History:  Diagnosis Date   Achilles tendinitis    Hypertension     Current Outpatient Medications:    amLODipine (NORVASC) 5 MG tablet, Take 1 tablet (5 mg total) by mouth daily., Disp: 90 tablet, Rfl: 1   atorvastatin (LIPITOR) 10 MG tablet, TAKE 1 TABLET BY MOUTH ONCE DAILY, Disp: 60 tablet, Rfl: 2   hydrochlorothiazide (MICROZIDE) 12.5 MG capsule, Take 1 capsule by mouth daily., Disp: 90 capsule, Rfl: 1   meloxicam (MOBIC) 15 MG tablet, Take 1 tablet (15 mg total) by mouth daily., Disp: 90 tablet, Rfl: 3  Social History   Tobacco Use  Smoking Status Every Day   Packs/day: 0.50   Years: 21.00   Pack years: 10.50   Types: Cigarettes  Smokeless Tobacco Never    Allergies  Allergen Reactions   Aspirin     rash   Pineapple     hives   Objective:  There were no vitals filed for this visit. There is no height or weight on file to calculate BMI. Constitutional Well developed. Well nourished.  Vascular Dorsalis pedis pulses palpable bilaterally. Posterior tibial pulses palpable bilaterally. Capillary refill normal to all digits.  No cyanosis or clubbing noted. Pedal hair growth normal.  Neurologic Normal speech. Oriented to person, place, and time. Epicritic sensation to light touch grossly present bilaterally.  Dermatologic Nails well groomed and normal in appearance. No open  wounds. No skin lesions.  Orthopedic: Pain on palpation of both Achilles tendon insertion.  Left greater than right side.  Positive Silfverskiold noted with gastrocnemius equinus.  Haglund's deformity noted bilaterally.  No pain at the posterior tibial tendon, peroneal tendon, ATFL ligament.  Pain with dorsiflexion of the ankle joint bilaterally.   Radiographs: NonePrior radiographs were reviewed of the left foot: Posterior heel spurring noted with Haglund's deformity identified.  Insertional Achilles tendinopathy with a 1.1 cm transverse by approximately 0.4 cm AP partial tear of the tendon at its insertion. Mild reactive marrow edema and spurring at the Achilles tendon insertion also noted. The exam is otherwise negative   Assessment:   1. Achilles tendinitis, right leg   2. Achilles tendinitis, left leg       Plan:  Patient was evaluated and treated and all questions answered.  Bilateral Achilles tendinitis with underlying Haglund's deformity/gastrocnemius equinus left greater than right -I explained the patient the etiology of Achilles tendinitis and various treatment options were extensively discussed given the amount of pain that she is havi breathing ng I believe she will benefit from cam boot immobilization to the left side and Tri-Lock ankle brace to the right side.  -MRI was discussed with the patient in extensive detail given the tearing that is present I believe patient will benefit from surgical intervention however patient would like to see if the PRP injection  helps. -She will be scheduled to get a second PRP injection done to bilateral heel with 1 injection -I will hold off on Mobic due to PRP interaction -Cam boot and Tri-Lock ankle brace were dispensed -Orthotics were evaluated seems to be functioning well  No follow-ups on file.

## 2021-07-17 ENCOUNTER — Telehealth: Payer: Self-pay | Admitting: *Deleted

## 2021-07-17 NOTE — Telephone Encounter (Signed)
-----   Message from Debbe Odea, MD sent at 07/14/2021  6:16 PM EST ----- Normal cardiac monitor with no significant arrhythmias.

## 2021-07-17 NOTE — Telephone Encounter (Signed)
Left voicemail message to call back for review of results.  

## 2021-07-18 NOTE — Telephone Encounter (Signed)
Left voicemail message to call back for review of results.  

## 2021-07-19 NOTE — Telephone Encounter (Signed)
3rd attempt to reach the patient by phone: The patient has reviewed her results on Mychart.  Attempted to call the patient. No answer- I left a message to please call back to discuss her results further if she has any questions/ concerns. Otherwise we will plan to see her on 08/04/21 at 8:20 am with Dr. Azucena Cecil.

## 2021-07-26 ENCOUNTER — Ambulatory Visit (INDEPENDENT_AMBULATORY_CARE_PROVIDER_SITE_OTHER): Payer: 59 | Admitting: Podiatry

## 2021-07-26 ENCOUNTER — Other Ambulatory Visit: Payer: Self-pay

## 2021-07-26 DIAGNOSIS — M7661 Achilles tendinitis, right leg: Secondary | ICD-10-CM

## 2021-07-26 DIAGNOSIS — M7662 Achilles tendinitis, left leg: Secondary | ICD-10-CM

## 2021-07-26 NOTE — Progress Notes (Signed)
PRP injection was delivered in standard technique to bilateral Achilles Kager's fat pad.  No complication noted.  3.5 cc of PRP injection to in each Achilles tendon.

## 2021-07-27 ENCOUNTER — Other Ambulatory Visit (HOSPITAL_COMMUNITY): Payer: Self-pay

## 2021-07-27 MED ORDER — AMOXICILLIN 500 MG PO CAPS
ORAL_CAPSULE | ORAL | 0 refills | Status: DC
  Filled 2021-07-27: qty 21, 7d supply, fill #0

## 2021-08-04 ENCOUNTER — Ambulatory Visit: Payer: 59 | Admitting: Cardiology

## 2021-08-04 ENCOUNTER — Encounter: Payer: Self-pay | Admitting: Cardiology

## 2021-08-04 ENCOUNTER — Other Ambulatory Visit: Payer: Self-pay

## 2021-08-04 VITALS — BP 132/92 | HR 90 | Ht 70.5 in | Wt 365.4 lb

## 2021-08-04 DIAGNOSIS — E7800 Pure hypercholesterolemia, unspecified: Secondary | ICD-10-CM | POA: Diagnosis not present

## 2021-08-04 DIAGNOSIS — R002 Palpitations: Secondary | ICD-10-CM

## 2021-08-04 DIAGNOSIS — I10 Essential (primary) hypertension: Secondary | ICD-10-CM

## 2021-08-04 DIAGNOSIS — F17200 Nicotine dependence, unspecified, uncomplicated: Secondary | ICD-10-CM | POA: Diagnosis not present

## 2021-08-04 NOTE — Patient Instructions (Signed)

## 2021-08-04 NOTE — Progress Notes (Signed)
Cardiology Office Note:    Date:  08/04/2021   ID:  Leslie Mendoza, DOB February 07, 1981, MRN [id number]  PCP:  [name], FNP   [facility] Providers Cardiologist:  Debbe Odea, MD     Referring MD: [name], FNP   No chief complaint on file. Patient presents for follow-up   History of Present Illness:    Leslie Mendoza is a 40 y.o. female with a hx of hypertension, hyperlipidemia, current smoker x20+ years who presents for follow-up.  She was last seen due to palpitations ongoing over a year associated with fatigue.  Cardiac monitor was placed to evaluate any significant arrhythmias.  Patient still smokes, is working on stopping.  Feels okay is on her way to work.  Denies any dizziness or syncope.   Past Medical History:  Diagnosis Date   Achilles tendinitis    Hypertension     Past Surgical History:  Procedure Laterality Date   CHOLECYSTECTOMY      Current Medications: Current Meds  Medication Sig   amLODipine (NORVASC) 5 MG tablet Take 1 tablet (5 mg total) by mouth daily.   atorvastatin (LIPITOR) 10 MG tablet TAKE 1 TABLET BY MOUTH ONCE DAILY   hydrochlorothiazide (MICROZIDE) 12.5 MG capsule Take 1 capsule by mouth daily.   meloxicam (MOBIC) 15 MG tablet Take 1 tablet (15 mg total) by mouth daily. (Patient taking differently: Take 15 mg by mouth as needed.)     Allergies:   Aspirin and Pineapple   Social History   Socioeconomic History   Marital status: Single    Spouse name: Not on file   Number of children: Not on file   Years of education: Not on file   Highest education level: Not on file  Occupational History   Not on file  Tobacco Use   Smoking status: Every Day    Packs/day: 0.50    Years: 21.00    Pack years: 10.50    Types: Cigarettes   Smokeless tobacco: Never  Vaping Use   Vaping Use: Former   Devices: years ago, used it 1 time  Substance and Sexual Activity   Alcohol use: No   Drug use: No   Sexual activity: Yes   Other Topics Concern   Not on file  Social History Narrative   Not on file   Social Determinants of Health   Financial Resource Strain: Not on file  Food Insecurity: Not on file  Transportation Needs: Not on file  Physical Activity: Not on file  Stress: Not on file  Social Connections: Not on file     Family History: The patient's family history includes Breast cancer in her mother; Cancer in her mother; Diabetes in her father; Heart disease in her father; Hypertension in her mother.  ROS:   Please see the history of present illness.     All other systems reviewed and are negative.  EKGs/Labs/Other Studies Reviewed:    The following studies were reviewed today:   EKG:  EKG not ordered today.    Recent Labs: 01/12/2021: ALT 28; BUN 18; Creatinine, Ser 0.93; Potassium 4.2; Sodium 143 03/15/2021: Hemoglobin 15.6; Platelets 206; TSH 0.898  Recent Lipid Panel    Component Value Date/Time   CHOL 169 01/12/2021 1042   TRIG 85 01/12/2021 1042   HDL 80 01/12/2021 1042   CHOLHDL 2.1 01/12/2021 1042   LDLCALC 73 01/12/2021 1042     Risk Assessment/Calculations:          Physical Exam:  VS:  BP (!) 132/92   Pulse 90   Ht 5' 10.5" (1.791 m)   Wt (!) 365 lb 6.4 oz (165.7 kg)   SpO2 98%   BMI 51.69 kg/m     Wt Readings from Last 3 Encounters:  08/04/21 (!) 365 lb 6.4 oz (165.7 kg)  06/16/21 (!) 355 lb (161 kg)  03/15/21 (!) 338 lb 3.2 oz (153.4 kg)     GEN:  Well nourished, well developed in no acute distress HEENT: Normal NECK: No JVD; No carotid bruits LYMPHATICS: No lymphadenopathy CARDIAC: RRR, no murmurs, rubs, gallops RESPIRATORY:  Clear to auscultation without rales, wheezing or rhonchi  ABDOMEN: Soft, non-tender, non-distended MUSCULOSKELETAL:  No edema; No deformity  SKIN: Warm and dry NEUROLOGIC:  Alert and oriented x 3 PSYCHIATRIC:  Normal affect   ASSESSMENT:    1. Palpitations   2. Primary hypertension   3. Pure hypercholesterolemia    4. Morbid obesity (HCC)   5. Smoking     PLAN:    In order of problems listed above:  Palpitations, cardiac monitor with no significant arrhythmias. Hypertension, BP controlled, continue HCTZ, amlodipine. Hyperlipidemia, Continue Lipitor 10 mg daily. Low-calorie diet, weight loss advised. Current smoker, smoking cessation again advised.  Follow-up after cardiac monitor.     Medication Adjustments/Labs and Tests Ordered: Current medicines are reviewed at length with the patient today.  Concerns regarding medicines are outlined above.  No orders of the defined types were placed in this encounter.   No orders of the defined types were placed in this encounter.   Patient Instructions  Medication Instructions:  Your physician recommends that you continue on your current medications as directed. Please refer to the Current Medication list given to you today.  *If you need a refill on your cardiac medications before your next appointment, please call your pharmacy*   Lab Work: None ordered If you have labs (blood work) drawn today and your tests are completely normal, you will receive your results only by: MyChart Message (if you have MyChart) OR A paper copy in the mail If you have any lab test that is abnormal or we need to change your treatment, we will call you to review the results.   Testing/Procedures: None ordered   Follow-Up: At [facility], you and your health needs are our priority.  As part of our continuing mission to provide you with exceptional heart care, we have created designated Provider Care Teams.  These Care Teams include your primary Cardiologist (physician) and Advanced Practice Providers (APPs -  Physician Assistants and Nurse Practitioners) who all work together to provide you with the care you need, when you need it.  We recommend signing up for the patient portal called "MyChart".  Sign up information is provided on this After Visit Summary.   MyChart is used to connect with patients for Virtual Visits (Telemedicine).  Patients are able to view lab/test results, encounter notes, upcoming appointments, etc.  Non-urgent messages can be sent to your provider as well.   To learn more about what you can do with MyChart, go to [email or web address].    Your next appointment:   Follow up as needed   The format for your next appointment:   In Person  Provider:   You may see Debbe Odea, MD or one of the following Advanced Practice Providers on your designated Care Team:   Nicolasa Ducking, NP Eula Listen, PA-C Cadence [place], PA-C    Other Instructions     Signed,  Debbe Odea, MD  08/04/2021 12:37 PM    [facility]

## 2021-08-17 ENCOUNTER — Other Ambulatory Visit (HOSPITAL_COMMUNITY): Payer: Self-pay

## 2021-08-24 ENCOUNTER — Ambulatory Visit: Payer: 59 | Admitting: Podiatry

## 2021-08-24 ENCOUNTER — Other Ambulatory Visit: Payer: Self-pay

## 2021-08-24 DIAGNOSIS — M7661 Achilles tendinitis, right leg: Secondary | ICD-10-CM

## 2021-08-24 DIAGNOSIS — M7662 Achilles tendinitis, left leg: Secondary | ICD-10-CM

## 2021-08-29 ENCOUNTER — Other Ambulatory Visit: Payer: Self-pay | Admitting: Nurse Practitioner

## 2021-08-29 ENCOUNTER — Ambulatory Visit: Payer: 59 | Admitting: Podiatry

## 2021-08-29 ENCOUNTER — Other Ambulatory Visit (HOSPITAL_COMMUNITY): Payer: Self-pay

## 2021-08-29 DIAGNOSIS — R7303 Prediabetes: Secondary | ICD-10-CM

## 2021-08-29 DIAGNOSIS — I10 Essential (primary) hypertension: Secondary | ICD-10-CM

## 2021-08-29 DIAGNOSIS — E782 Mixed hyperlipidemia: Secondary | ICD-10-CM

## 2021-08-29 MED ORDER — HYDROCHLOROTHIAZIDE 12.5 MG PO CAPS
12.5000 mg | ORAL_CAPSULE | Freq: Every day | ORAL | 1 refills | Status: DC
  Filled 2021-08-29 – 2021-10-22 (×2): qty 90, 90d supply, fill #0
  Filled 2022-01-23: qty 90, 90d supply, fill #1

## 2021-08-29 MED ORDER — ATORVASTATIN CALCIUM 10 MG PO TABS
10.0000 mg | ORAL_TABLET | Freq: Every day | ORAL | 2 refills | Status: DC
Start: 2021-08-29 — End: 2022-04-06
  Filled 2021-08-29: qty 60, 60d supply, fill #0
  Filled 2021-09-24: qty 90, 90d supply, fill #0
  Filled 2022-01-23: qty 90, 90d supply, fill #1

## 2021-08-29 MED ORDER — AMLODIPINE BESYLATE 5 MG PO TABS
5.0000 mg | ORAL_TABLET | Freq: Every day | ORAL | 1 refills | Status: DC
  Filled 2021-08-29: qty 90, 90d supply, fill #0
  Filled 2021-12-27: qty 90, 90d supply, fill #1

## 2021-08-29 NOTE — Progress Notes (Signed)
Subjective:  Patient ID: Leslie Mendoza, female    DOB: 12-Mar-1981,  MRN: [id number]  Chief Complaint  Patient presents with   Foot Pain    41 y.o. female presents with the above complaint.  Patient presents with follow-up of bilateral posterior Achilles tendon.  She has been doing 2 PRP injections which gave her about 90 to 95% relief.  She does not have any new concerns.  She states that she is doing a whole lot better.  Her pain has improved considerably.  She denies any other acute complaints.   Review of Systems: Negative except as noted in the HPI. Denies N/V/F/Ch.  Past Medical History:  Diagnosis Date   Achilles tendinitis    Hypertension     Current Outpatient Medications:    amLODipine (NORVASC) 5 MG tablet, Take 1 tablet (5 mg total) by mouth daily., Disp: 90 tablet, Rfl: 1   atorvastatin (LIPITOR) 10 MG tablet, TAKE 1 TABLET BY MOUTH ONCE DAILY, Disp: 60 tablet, Rfl: 2   hydrochlorothiazide (MICROZIDE) 12.5 MG capsule, Take 1 capsule by mouth daily., Disp: 90 capsule, Rfl: 1   meloxicam (MOBIC) 15 MG tablet, Take 1 tablet (15 mg total) by mouth daily. (Patient taking differently: Take 15 mg by mouth as needed.), Disp: 90 tablet, Rfl: 3  Social History   Tobacco Use  Smoking Status Every Day   Packs/day: 0.50   Years: 21.00   Pack years: 10.50   Types: Cigarettes  Smokeless Tobacco Never    Allergies  Allergen Reactions   Aspirin     rash   Pineapple     hives   Objective:  There were no vitals filed for this visit. There is no height or weight on file to calculate BMI. Constitutional Well developed. Well nourished.  Vascular Dorsalis pedis pulses palpable bilaterally. Posterior tibial pulses palpable bilaterally. Capillary refill normal to all digits.  No cyanosis or clubbing noted. Pedal hair growth normal.  Neurologic Normal speech. Oriented to person, place, and time. Epicritic sensation to light touch grossly present bilaterally.   Dermatologic Nails well groomed and normal in appearance. No open wounds. No skin lesions.  Orthopedic: No further pain on palpation of both Achilles tendon insertion.  Left greater than right side.  Positive Silfverskiold noted with gastrocnemius equinus.  Haglund's deformity noted bilaterally.  No pain at the posterior tibial tendon, peroneal tendon, ATFL ligament.  No further pain with dorsiflexion of the ankle joint bilaterally.   Radiographs: NonePrior radiographs were reviewed of the left foot: Posterior heel spurring noted with Haglund's deformity identified.  Insertional Achilles tendinopathy with a 1.1 cm transverse by approximately 0.4 cm AP partial tear of the tendon at its insertion. Mild reactive marrow edema and spurring at the Achilles tendon insertion also noted. The exam is otherwise negative   Assessment:   No diagnosis found.     Plan:  Patient was evaluated and treated and all questions answered.  Bilateral Achilles tendinitis with underlying Haglund's deformity/gastrocnemius equinus left greater than right -I clinically healed with 2 PRP injections to bilateral heel.  She states that she is 90 to 95% better.  Once a while she notices a stiffness.  At this time I discussed with the patient the importance of shoe gear modification continue usage of orthotics.  She states understanding. -I discussed with her that if it continues to recur we can plan on doing 1/3 injection at that time.  She states understanding. -Orthotics were evaluated seems to be functioning well  No follow-ups on file.

## 2021-09-14 ENCOUNTER — Other Ambulatory Visit (HOSPITAL_COMMUNITY): Payer: Self-pay

## 2021-09-14 MED ORDER — AMOXICILLIN 500 MG PO CAPS
ORAL_CAPSULE | ORAL | 0 refills | Status: DC
  Filled 2021-09-14: qty 28, 9d supply, fill #0

## 2021-09-25 ENCOUNTER — Other Ambulatory Visit (HOSPITAL_COMMUNITY): Payer: Self-pay

## 2021-10-05 ENCOUNTER — Other Ambulatory Visit (HOSPITAL_COMMUNITY): Payer: Self-pay

## 2021-10-23 ENCOUNTER — Other Ambulatory Visit (HOSPITAL_COMMUNITY): Payer: Self-pay

## 2021-12-27 ENCOUNTER — Other Ambulatory Visit (HOSPITAL_COMMUNITY): Payer: Self-pay

## 2022-01-18 ENCOUNTER — Encounter: Payer: Self-pay | Admitting: Nurse Practitioner

## 2022-01-18 ENCOUNTER — Other Ambulatory Visit: Payer: Self-pay | Admitting: Nurse Practitioner

## 2022-01-18 ENCOUNTER — Other Ambulatory Visit (HOSPITAL_COMMUNITY): Payer: Self-pay

## 2022-01-18 ENCOUNTER — Ambulatory Visit (INDEPENDENT_AMBULATORY_CARE_PROVIDER_SITE_OTHER): Payer: 59 | Admitting: Nurse Practitioner

## 2022-01-18 VITALS — BP 132/80 | HR 93 | Temp 98.1°F | Ht 70.5 in | Wt 379.2 lb

## 2022-01-18 DIAGNOSIS — E782 Mixed hyperlipidemia: Secondary | ICD-10-CM | POA: Diagnosis not present

## 2022-01-18 DIAGNOSIS — R7303 Prediabetes: Secondary | ICD-10-CM

## 2022-01-18 DIAGNOSIS — R9431 Abnormal electrocardiogram [ECG] [EKG]: Secondary | ICD-10-CM

## 2022-01-18 DIAGNOSIS — Z6841 Body Mass Index (BMI) 40.0 and over, adult: Secondary | ICD-10-CM

## 2022-01-18 DIAGNOSIS — E559 Vitamin D deficiency, unspecified: Secondary | ICD-10-CM

## 2022-01-18 DIAGNOSIS — Z0000 Encounter for general adult medical examination without abnormal findings: Secondary | ICD-10-CM

## 2022-01-18 DIAGNOSIS — I10 Essential (primary) hypertension: Secondary | ICD-10-CM

## 2022-01-18 DIAGNOSIS — R5383 Other fatigue: Secondary | ICD-10-CM

## 2022-01-18 MED ORDER — SAXENDA 18 MG/3ML ~~LOC~~ SOPN
3.0000 mg | PEN_INJECTOR | Freq: Every day | SUBCUTANEOUS | 1 refills | Status: DC
Start: 2022-01-18 — End: 2022-01-18
  Filled 2022-01-18: qty 5, 10d supply, fill #0

## 2022-01-18 NOTE — Patient Instructions (Signed)

## 2022-01-18 NOTE — Progress Notes (Signed)
I, J Llittleton,acting as a Education administrator for [employer], FNP.,have documented all relevant documentation on the behalf of Minette Brine, FNP,as directed by  Minette Brine, FNP while in the presence of Minette Brine, [place].   This visit occurred during the SARS-CoV-2 public health emergency.  Safety protocols were in place, including screening questions prior to the visit, additional usage of staff PPE, and extensive cleaning of exam room while observing appropriate contact time as indicated for disinfecting solutions.  Subjective:     Patient ID: Leslie Mendoza , female    DOB: 04/03/1981 , 41 y.o.   MRN: [id number]   Chief Complaint  Patient presents with   Annual Exam    HPI  Here for HM. Patient reports compliance with her meds. Patient has no concerns or questions today.  She has now started school, she is back down to 5 days a week of working. She is in school for histocytology.   Wt Readings from Last 3 Encounters: 01/18/22 : (!) 379 lb 3.2 oz (172 kg) 08/04/21 : (!) 365 lb 6.4 oz (165.7 kg) 06/16/21 : (!) 355 lb (161 kg)  She had been having difficulty with getting her wegovy due to not being able to get the medications.      Past Medical History:  Diagnosis Date   Achilles tendinitis    Hypertension      Family History  Problem Relation Age of Onset   Cancer Mother    Hypertension Mother    Breast cancer Mother    Heart disease Father    Diabetes Father      Current Outpatient Medications:    amLODipine (NORVASC) 5 MG tablet, Take 1 tablet (5 mg total) by mouth daily., Disp: 90 tablet, Rfl: 1   atorvastatin (LIPITOR) 10 MG tablet, TAKE 1 TABLET BY MOUTH ONCE DAILY, Disp: 60 tablet, Rfl: 2   hydrochlorothiazide (MICROZIDE) 12.5 MG capsule, Take 1 capsule by mouth daily., Disp: 90 capsule, Rfl: 1   Liraglutide -Weight Management (SAXENDA) 18 MG/3ML SOPN, Inject 3 mg into the skin daily., Disp: 5 mL, Rfl: 1   meloxicam (MOBIC) 15 MG tablet, Take 1 tablet (15 mg total) by  mouth daily. (Patient taking differently: Take 15 mg by mouth as needed.), Disp: 90 tablet, Rfl: 3   Allergies  Allergen Reactions   Aspirin     rash   Pineapple     hives      The patient states she uses none for birth control.  No LMP recorded. (Menstrual status: Other).  She has been amenorrhea for several years.  Negative for: breast discharge, breast lump(s), breast pain and breast self exam. Associated symptoms include abnormal vaginal bleeding. Pertinent negatives include abnormal bleeding (hematology), anxiety, decreased libido, depression, difficulty falling sleep, dyspareunia, history of infertility, nocturia, sexual dysfunction, sleep disturbances, urinary incontinence, urinary urgency, vaginal discharge and vaginal itching. Diet regular; admits to eating a "horrible" diet. The patient states her exercise level is none.   The patient's tobacco use is:  Social History   Tobacco Use  Smoking Status Every Day   Packs/day: 0.50   Years: 21.00   Pack years: 10.50   Types: Cigarettes  Smokeless Tobacco Never   She has been exposed to passive smoke. The patient's alcohol use is:  Social History   Substance and Sexual Activity  Alcohol Use No   Additional information: Last pap 01/05/2019,  will schedule her next PAP declined to do today    Review of Systems  Constitutional:  Positive for fatigue.  HENT: Negative.    Eyes: Negative.   Respiratory: Negative.    Cardiovascular: Negative.   Gastrointestinal: Negative.   Endocrine: Negative.   Genitourinary: Negative.   Musculoskeletal: Negative.   Skin: Negative.   Allergic/Immunologic: Negative.   Neurological: Negative.   Hematological: Negative.   Psychiatric/Behavioral: Negative.      Today's Vitals   01/18/22 0839  BP: 132/80  Pulse: 93  Temp: 98.1 F (36.7 C)  Weight: (!) 379 lb 3.2 oz (172 kg)  Height: 5' 10.5" (1.791 m)  PainSc: 4    Body mass index is 53.64 kg/m.   Wt Readings from Last 3  Encounters:  01/18/22 (!) 379 lb 3.2 oz (172 kg)  08/04/21 (!) 365 lb 6.4 oz (165.7 kg)  06/16/21 (!) 355 lb (161 kg)     Objective:  Physical Exam Vitals reviewed.  Constitutional:      General: She is not in acute distress.    Appearance: Normal appearance. She is well-developed. She is obese.  HENT:     Head: Normocephalic and atraumatic.     Right Ear: Hearing, tympanic membrane, ear canal and external ear normal. There is no impacted cerumen.     Left Ear: Hearing, tympanic membrane, ear canal and external ear normal. There is no impacted cerumen.     Nose:     Comments: Deferred - masked    Mouth/Throat:     Comments: Deferred - masked Eyes:     General: Lids are normal.     Extraocular Movements: Extraocular movements intact.     Conjunctiva/sclera: Conjunctivae normal.     Pupils: Pupils are equal, round, and reactive to light.     Funduscopic exam:    Right eye: No papilledema.        Left eye: No papilledema.  Neck:     Thyroid: No thyroid mass.     Vascular: No carotid bruit.  Cardiovascular:     Rate and Rhythm: Normal rate and regular rhythm.     Pulses: Normal pulses.     Heart sounds: Normal heart sounds. No murmur heard. Pulmonary:     Effort: Pulmonary effort is normal. No respiratory distress.     Breath sounds: Normal breath sounds. No wheezing.  Chest:     Chest wall: No mass.  Breasts:    Tanner Score is 5.     Right: Normal. No mass or tenderness.     Left: Normal. No mass or tenderness.  Abdominal:     General: Abdomen is flat. Bowel sounds are normal. There is no distension.     Palpations: Abdomen is soft.     Tenderness: There is no abdominal tenderness.  Genitourinary:    Comments: Declined PAP Musculoskeletal:        General: No swelling or tenderness. Normal range of motion.     Cervical back: Full passive range of motion without pain, normal range of motion and neck supple.     Right lower leg: No edema.     Left lower leg: No edema.   Lymphadenopathy:     Upper Body:     Right upper body: No supraclavicular, axillary or pectoral adenopathy.     Left upper body: No supraclavicular, axillary or pectoral adenopathy.  Skin:    General: Skin is warm and dry.     Capillary Refill: Capillary refill takes less than 2 seconds.  Neurological:     General: No focal deficit present.  Mental Status: She is alert and oriented to person, place, and time.     Cranial Nerves: No cranial nerve deficit.     Sensory: No sensory deficit.     Motor: No weakness.  Psychiatric:        Mood and Affect: Mood normal.        Behavior: Behavior normal.        Thought Content: Thought content normal.        Judgment: Judgment normal.        Assessment And Plan:     1. Encounter for general adult medical examination w/o abnormal findings Behavior modifications discussed and diet history reviewed.   Pt will continue to exercise regularly and modify diet with low GI, plant based foods and decrease intake of processed foods.  Recommend intake of daily multivitamin, Vitamin D, and calcium.  Recommend mammogram (up to date) for preventive screenings, as well as recommend immunizations that include influenza, TDAP - CBC - CMP14+EGFR  2. Class 3 severe obesity due to excess calories with body mass index (BMI) of 50.0 to 59.9 in adult, unspecified whether serious comorbidity present ([place]) Comments: I have stressed the importance of exercising more regularly and eating a healthy diet. Talked with patient about weight loss resistance.  - Liraglutide -Weight Management (SAXENDA) 18 MG/3ML SOPN; Inject 3 mg into the skin daily.  Dispense: 5 mL; Refill: 1  3. Essential hypertension Comments: Blood pressure is fairly controlled, continue current medications. EKG done. EKG done with Sinus  Rhythm. Incomplete left bundle branch block.   Negative precordial T-waves  -Probably normal -consider anteroseptal ischemia.  - POCT Urinalysis Dipstick  (81002) - Microalbumin / Creatinine Urine Ratio - EKG 12-Lead - Lipid panel  4. Prediabetes Comments: HgbA1c is stable, continue focusing on healthy diet and regular exercise as tolerated.  - Hemoglobin A1c  5. Mixed hyperlipidemia Comments: Cholesterol levels are stable, encouraged to eat a low fat diet.   6. Vitamin D deficiency Will check vitamin D level and supplement as needed.    Also encouraged to spend 15 minutes in the sun daily.  - VITAMIN D 25 Hydroxy (Vit-D Deficiency, Fractures)  7. Other fatigue Comments: Will check for metabolic causes.  - Vitamin B12     Patient was given opportunity to ask questions. Patient verbalized understanding of the plan and was able to repeat key elements of the plan. All questions were answered to their satisfaction.   Minette Brine, FNP   I, Minette Brine, FNP, have reviewed all documentation for this visit. The documentation on 01/18/22 for the exam, diagnosis, procedures, and orders are all accurate and complete.   THE PATIENT IS ENCOURAGED TO PRACTICE SOCIAL DISTANCING DUE TO THE COVID-19 PANDEMIC.

## 2022-01-19 ENCOUNTER — Other Ambulatory Visit (HOSPITAL_COMMUNITY): Payer: Self-pay

## 2022-01-19 LAB — MICROALBUMIN / CREATININE URINE RATIO
Creatinine, Urine: 198.9 mg/dL
Microalb/Creat Ratio: 11 mg/g creat (ref 0–29)
Microalbumin, Urine: 21.5 ug/mL

## 2022-01-19 LAB — CBC
Hematocrit: 46.6 % (ref 34.0–46.6)
Hemoglobin: 15.3 g/dL (ref 11.1–15.9)
MCH: 28.2 pg (ref 26.6–33.0)
MCHC: 32.8 g/dL (ref 31.5–35.7)
MCV: 86 fL (ref 79–97)
Platelets: 232 10*3/uL (ref 150–450)
RBC: 5.43 x10E6/uL — ABNORMAL HIGH (ref 3.77–5.28)
RDW: 12.8 % (ref 11.7–15.4)
WBC: 6.1 10*3/uL (ref 3.4–10.8)

## 2022-01-19 LAB — CMP14+EGFR
ALT: 35 IU/L — ABNORMAL HIGH (ref 0–32)
AST: 28 IU/L (ref 0–40)
Albumin/Globulin Ratio: 1.8 (ref 1.2–2.2)
Albumin: 4.7 g/dL (ref 3.8–4.8)
Alkaline Phosphatase: 78 IU/L (ref 44–121)
BUN/Creatinine Ratio: 14 (ref 9–23)
BUN: 14 mg/dL (ref 6–24)
Bilirubin Total: 0.6 mg/dL (ref 0.0–1.2)
CO2: 25 mmol/L (ref 20–29)
Calcium: 9.6 mg/dL (ref 8.7–10.2)
Chloride: 100 mmol/L (ref 96–106)
Creatinine, Ser: 0.98 mg/dL (ref 0.57–1.00)
Globulin, Total: 2.6 g/dL (ref 1.5–4.5)
Glucose: 131 mg/dL — ABNORMAL HIGH (ref 70–99)
Potassium: 4 mmol/L (ref 3.5–5.2)
Sodium: 140 mmol/L (ref 134–144)
Total Protein: 7.3 g/dL (ref 6.0–8.5)
eGFR: 74 mL/min/{1.73_m2} (ref >59)

## 2022-01-19 LAB — VITAMIN D 25 HYDROXY (VIT D DEFICIENCY, FRACTURES): Vit D, 25-Hydroxy: 13.1 ng/mL — ABNORMAL LOW (ref 30.0–100.0)

## 2022-01-19 LAB — LIPID PANEL
Chol/HDL Ratio: 3.3 ratio (ref 0.0–4.4)
Cholesterol, Total: 190 mg/dL (ref 100–199)
HDL: 58 mg/dL (ref >39)
LDL Chol Calc (NIH): 105 mg/dL — ABNORMAL HIGH (ref 0–99)
Triglycerides: 156 mg/dL — ABNORMAL HIGH (ref 0–149)
VLDL Cholesterol Cal: 27 mg/dL (ref 5–40)

## 2022-01-19 LAB — HEMOGLOBIN A1C
Est. average glucose Bld gHb Est-mCnc: 134 mg/dL
Hgb A1c MFr Bld: 6.3 % — ABNORMAL HIGH (ref 4.8–5.6)

## 2022-01-19 LAB — VITAMIN B12: Vitamin B-12: 426 pg/mL (ref 232–1245)

## 2022-01-19 MED ORDER — SAXENDA 18 MG/3ML ~~LOC~~ SOPN
3.0000 mg | PEN_INJECTOR | Freq: Every day | SUBCUTANEOUS | 1 refills | Status: DC
  Filled 2022-01-19: qty 15, 30d supply, fill #0
  Filled 2022-04-06: qty 15, 30d supply, fill #1

## 2022-01-23 ENCOUNTER — Other Ambulatory Visit (HOSPITAL_COMMUNITY): Payer: Self-pay

## 2022-01-24 ENCOUNTER — Other Ambulatory Visit (HOSPITAL_COMMUNITY): Payer: Self-pay

## 2022-01-26 ENCOUNTER — Other Ambulatory Visit (HOSPITAL_COMMUNITY): Payer: Self-pay

## 2022-01-26 ENCOUNTER — Encounter: Payer: Self-pay | Admitting: Nurse Practitioner

## 2022-01-26 MED ORDER — VITAMIN D (ERGOCALCIFEROL) 1.25 MG (50000 UNIT) PO CAPS
50000.0000 [IU] | ORAL_CAPSULE | ORAL | 1 refills | Status: DC
  Filled 2022-01-26: qty 12, 84d supply, fill #0

## 2022-01-29 ENCOUNTER — Other Ambulatory Visit (HOSPITAL_COMMUNITY): Payer: Self-pay

## 2022-02-05 ENCOUNTER — Other Ambulatory Visit (HOSPITAL_COMMUNITY): Payer: Self-pay

## 2022-02-06 ENCOUNTER — Other Ambulatory Visit (HOSPITAL_COMMUNITY): Payer: Self-pay

## 2022-02-06 MED ORDER — UNIFINE PENTIPS 32G X 6 MM MISC
1 refills | Status: DC
  Filled 2022-02-06: qty 100, 90d supply, fill #0
  Filled 2022-04-06 – 2022-05-21 (×2): qty 100, 90d supply, fill #1

## 2022-02-23 ENCOUNTER — Other Ambulatory Visit (HOSPITAL_COMMUNITY): Payer: Self-pay

## 2022-02-23 ENCOUNTER — Other Ambulatory Visit: Payer: Self-pay

## 2022-02-23 ENCOUNTER — Encounter: Payer: Self-pay | Admitting: Nurse Practitioner

## 2022-02-23 MED ORDER — VITAMIN D (ERGOCALCIFEROL) 1.25 MG (50000 UNIT) PO CAPS
50000.0000 [IU] | ORAL_CAPSULE | ORAL | 1 refills | Status: DC
  Filled 2022-02-23 – 2022-04-06 (×2): qty 12, 84d supply, fill #0

## 2022-03-10 ENCOUNTER — Encounter: Payer: Self-pay | Admitting: Emergency Medicine

## 2022-03-10 ENCOUNTER — Ambulatory Visit
Admission: EM | Admit: 2022-03-10 | Discharge: 2022-03-10 | Disposition: A | Discharge status: Home / Self Care | Payer: 59 | Attending: Emergency Medicine | Admitting: Emergency Medicine

## 2022-03-10 DIAGNOSIS — J029 Acute pharyngitis, unspecified: Secondary | ICD-10-CM

## 2022-03-10 DIAGNOSIS — R0602 Shortness of breath: Secondary | ICD-10-CM | POA: Diagnosis not present

## 2022-03-10 DIAGNOSIS — R051 Acute cough: Secondary | ICD-10-CM | POA: Diagnosis not present

## 2022-03-10 DIAGNOSIS — I10 Essential (primary) hypertension: Secondary | ICD-10-CM

## 2022-03-10 LAB — POCT RAPID STREP A (OFFICE): Rapid Strep A Screen: NEGATIVE

## 2022-03-10 MED ORDER — ALBUTEROL SULFATE HFA 108 (90 BASE) MCG/ACT IN AERS: 1.0000 | INHALATION_SPRAY | Freq: Four times a day (QID) | RESPIRATORY_TRACT | 0 refills | Status: AC | PRN

## 2022-03-10 MED ORDER — BENZONATATE 100 MG PO CAPS: 100.0000 mg | ORAL_CAPSULE | Freq: Three times a day (TID) | ORAL | 0 refills | Status: DC | PRN

## 2022-03-10 NOTE — Discharge Instructions (Addendum)
Go to the emergency department if you have shortness of breath or other concerning symptoms.    The strep test is negative.  Use the albuterol inhaler and take the [facility] as directed.  Take Tylenol or ibuprofen as needed. Follow up with your primary care provider if your symptoms are not improving.    Your blood pressure is elevated today at 159/102; repeat 132/88.  Please have this rechecked by your primary care provider in 2-4 weeks.

## 2022-03-10 NOTE — ED Triage Notes (Signed)
Patient c/o sore throat, SOB, and nonproductive cough x 1 day.   Patient denies fever. Patient denies painful swallowing.   Patient denies history of Asthma.   Patient has taken tylenol with no relief of symptoms.

## 2022-03-10 NOTE — ED Provider Notes (Signed)
Leslie Mendoza    CSN: [id number] Arrival date & time: 03/10/22  1156      History   Chief Complaint Chief Complaint  Patient presents with   Shortness of Breath   Cough   Sore Throat    HPI Leslie Mendoza is a 41 y.o. female.  Patient presents with sore throat, cough, shortness of breath x 1 day.  No fever, rash, chest pain, vomiting, diarrhea, or other symptoms.  Treatment at home with Tylenol.  Her medical history includes hypertension, prediabetes, obesity.  Current everyday smoker.   The history is provided by the patient and medical records.    Past Medical History:  Diagnosis Date   Achilles tendinitis    Hypertension     Patient Active Problem List   Diagnosis Date Noted   Prediabetes 10/06/2018   Heart palpitations 10/06/2018   Mixed hyperlipidemia 10/06/2018   Essential hypertension 10/06/2018   Vitamin D deficiency 10/06/2018    Past Surgical History:  Procedure Laterality Date   CHOLECYSTECTOMY      OB History   No obstetric history on file.      Home Medications    Prior to Admission medications   Medication Sig Start Date End Date Taking? Authorizing Provider  albuterol (VENTOLIN HFA) 108 (90 Base) MCG/ACT inhaler Inhale 1-2 puffs into the lungs every 6 (six) hours as needed for wheezing or shortness of breath. 03/10/22  Yes Mickie Bail, NP  benzonatate (TESSALON) 100 MG capsule Take 1 capsule (100 mg total) by mouth 3 (three) times daily as needed for cough. 03/10/22  Yes Mickie Bail, NP  amLODipine (NORVASC) 5 MG tablet Take 1 tablet (5 mg total) by mouth daily. 08/29/21   [name], FNP  atorvastatin (LIPITOR) 10 MG tablet TAKE 1 TABLET BY MOUTH ONCE DAILY 08/29/21   [name], FNP  hydrochlorothiazide (MICROZIDE) 12.5 MG capsule Take 1 capsule by mouth daily. 08/29/21   [name], FNP  Insulin Pen Needle (UNIFINE PENTIPS) 32G X 6 MM MISC Use with Saxenda daily 01/19/22   [name], FNP  Liraglutide -Weight Management  (SAXENDA) 18 MG/3ML SOPN Inject 3 mg into the skin daily. 01/19/22   [name], FNP  meloxicam (MOBIC) 15 MG tablet Take 1 tablet (15 mg total) by mouth daily. Patient taking differently: Take 15 mg by mouth as needed. 04/06/21   Candelaria Stagers, DPM  Vitamin D, Ergocalciferol, (DRISDOL) 1.25 MG (50000 UNIT) CAPS capsule Take 1 capsule (50,000 Units total) by mouth every 7 (seven) days. 02/23/22   [name], FNP    Family History Family History  Problem Relation Age of Onset   Cancer Mother    Hypertension Mother    Breast cancer Mother    Heart disease Father    Diabetes Father     Social History Social History   Tobacco Use   Smoking status: Every Day    Packs/day: 0.50    Years: 21.00    Total pack years: 10.50    Types: Cigarettes   Smokeless tobacco: Never  Vaping Use   Vaping Use: Former   Devices: years ago, used it 1 time  Substance Use Topics   Alcohol use: No   Drug use: No     Allergies   Aspirin and Pineapple   Review of Systems Review of Systems  Constitutional:  Negative for chills and fever.  HENT:  Positive for sore throat. Negative for ear pain.   Respiratory:  Positive for cough  and shortness of breath.   Cardiovascular:  Negative for chest pain and palpitations.  Gastrointestinal:  Negative for diarrhea and vomiting.  Skin:  Negative for color change and rash.  All other systems reviewed and are negative.    Physical Exam Triage Vital Signs ED Triage Vitals  Enc Vitals Group     BP      Pulse      Resp      Temp      Temp src      SpO2      Weight      Height      Head Circumference      Peak Flow      Pain Score      Pain Loc      Pain Edu?      Excl. in GC?    No data found.  Updated Vital Signs BP 132/88 (BP Location: Right Arm)   Pulse 95   Temp 98 F (36.7 C)   Resp 18   SpO2 98%   Visual Acuity Right Eye Distance:   Left Eye Distance:   Bilateral Distance:    Right Eye Near:   Left Eye Near:     Bilateral Near:     Physical Exam Vitals and nursing note reviewed.  Constitutional:      General: She is not in acute distress.    Appearance: She is well-developed. She is obese. She is not ill-appearing.  HENT:     Right Ear: Tympanic membrane normal.     Left Ear: Tympanic membrane normal.     Nose: Nose normal.     Mouth/Throat:     Mouth: Mucous membranes are moist.     Pharynx: Oropharynx is clear.  Cardiovascular:     Rate and Rhythm: Normal rate and regular rhythm.     Heart sounds: Normal heart sounds.  Pulmonary:     Effort: Pulmonary effort is normal. No respiratory distress.     Breath sounds: Normal breath sounds.  Musculoskeletal:     Cervical back: Neck supple.  Skin:    General: Skin is warm and dry.  Neurological:     Mental Status: She is alert.  Psychiatric:        Mood and Affect: Mood normal.        Behavior: Behavior normal.      UC Treatments / Results  Labs (all labs ordered are listed, but only abnormal results are displayed) Labs Reviewed  POCT RAPID STREP A (OFFICE)    EKG   Radiology No results found.  Procedures Procedures (including critical care time)  Medications Ordered in UC Medications - No data to display  Initial Impression / Assessment and Plan / UC Course  I have reviewed the triage vital signs and the nursing notes.  Pertinent labs & imaging results that were available during my care of the patient were reviewed by me and considered in my medical decision making (see chart for details).  Sore throat, cough, shortness of breath.  Elevated blood pressure reading with hypertension.  Patient is well-appearing and her exam is reassuring.  No respiratory distress, lungs are clear, O2 sat 98% on room air.  Rapid strep negative.  Treating with albuterol inhaler and Tessalon Perles.  ED precautions discussed.  Discussed symptomatic treatment with Tylenol or ibuprofen as needed.  Instructed her to follow-up with her PCP if her  symptoms or not improving.  Education provided on shortness of breath, cough, sore throat.  Also discussed with patient that her blood pressure is elevated today and needs to be rechecked by PCP in 2 to 4 weeks.  Education provided on managing hypertension.  She agrees to plan of care.   Final Clinical Impressions(s) / UC Diagnoses   Final diagnoses:  Sore throat  Acute cough  Shortness of breath  Elevated blood pressure reading in office with diagnosis of hypertension     Discharge Instructions      Go to the emergency department if you have shortness of breath or other concerning symptoms.    The strep test is negative.  Use the albuterol inhaler and take the [facility] as directed.  Take Tylenol or ibuprofen as needed. Follow up with your primary care provider if your symptoms are not improving.    Your blood pressure is elevated today at 159/102; repeat 132/88.  Please have this rechecked by your primary care provider in 2-4 weeks.          ED Prescriptions     Medication Sig Dispense Auth. Provider   albuterol (VENTOLIN HFA) 108 (90 Base) MCG/ACT inhaler Inhale 1-2 puffs into the lungs every 6 (six) hours as needed for wheezing or shortness of breath. 18 g Mickie Bail, NP   benzonatate (TESSALON) 100 MG capsule Take 1 capsule (100 mg total) by mouth 3 (three) times daily as needed for cough. 21 capsule Mickie Bail, NP      PDMP not reviewed this encounter.   Mickie Bail, NP 03/10/22 1241

## 2022-03-27 ENCOUNTER — Ambulatory Visit: Payer: 59 | Admitting: Cardiology

## 2022-03-27 ENCOUNTER — Encounter: Payer: Self-pay | Admitting: Cardiology

## 2022-03-27 VITALS — BP 130/90 | HR 71 | Ht 70.0 in | Wt 370.0 lb

## 2022-03-27 DIAGNOSIS — F17200 Nicotine dependence, unspecified, uncomplicated: Secondary | ICD-10-CM | POA: Diagnosis not present

## 2022-03-27 DIAGNOSIS — I10 Essential (primary) hypertension: Secondary | ICD-10-CM | POA: Diagnosis not present

## 2022-03-27 DIAGNOSIS — E7800 Pure hypercholesterolemia, unspecified: Secondary | ICD-10-CM | POA: Diagnosis not present

## 2022-03-27 DIAGNOSIS — R9431 Abnormal electrocardiogram [ECG] [EKG]: Secondary | ICD-10-CM

## 2022-03-27 NOTE — Patient Instructions (Signed)
Medication Instructions:   Your physician recommends that you continue on your current medications as directed. Please refer to the Current Medication list given to you today.  *If you need a refill on your cardiac medications before your next appointment, please call your pharmacy*    Follow-Up: At [facility], you and your health needs are our priority.  As part of our continuing mission to provide you with exceptional heart care, we have created designated Provider Care Teams.  These Care Teams include your primary Cardiologist (physician) and Advanced Practice Providers (APPs -  Physician Assistants and Nurse Practitioners) who all work together to provide you with the care you need, when you need it.  We recommend signing up for the patient portal called "MyChart".  Sign up information is provided on this After Visit Summary.  MyChart is used to connect with patients for Virtual Visits (Telemedicine).  Patients are able to view lab/test results, encounter notes, upcoming appointments, etc.  Non-urgent messages can be sent to your provider as well.   To learn more about what you can do with MyChart, go to [email or web address].    Your next appointment:   Follow up as needed   The format for your next appointment:   In Person  Provider:   Brian Agbor-Etang, MD     Important Information About Sugar       

## 2022-03-27 NOTE — Progress Notes (Signed)
Cardiology Office Note:    Date:  03/27/2022   ID:  Leslie Mendoza, DOB Apr 27, 1981, MRN [id number]  PCP:  [name], FNP   [facility] Providers Cardiologist:  Debbe Odea, MD     Referring MD: [name], FNP   Chief Complaint  Patient presents with   office visit-Referred by PCP for eval of abnormal EKG   Patient presents for follow-up   History of Present Illness:    Leslie Mendoza is a 41 y.o. female with a hx of hypertension, hyperlipidemia, current smoker x20+ years who presents due to abnormal EKG  Patient had a regular visit with primary care provider 3 months ago, EKG obtained showed precordial T wave inversions.  She denies chest pain, shortness of breath.  Takes her medications as prescribed, but did not take BP medications this morning.  Has no concerns at this time.   Past Medical History:  Diagnosis Date   Achilles tendinitis    Hypertension     Past Surgical History:  Procedure Laterality Date   CHOLECYSTECTOMY      Current Medications: Current Meds  Medication Sig   albuterol (VENTOLIN HFA) 108 (90 Base) MCG/ACT inhaler Inhale 1-2 puffs into the lungs every 6 (six) hours as needed for wheezing or shortness of breath.   amLODipine (NORVASC) 5 MG tablet Take 1 tablet (5 mg total) by mouth daily.   atorvastatin (LIPITOR) 10 MG tablet TAKE 1 TABLET BY MOUTH ONCE DAILY   benzonatate (TESSALON) 100 MG capsule Take 1 capsule (100 mg total) by mouth 3 (three) times daily as needed for cough.   hydrochlorothiazide (MICROZIDE) 12.5 MG capsule Take 1 capsule by mouth daily.   Insulin Pen Needle (UNIFINE PENTIPS) 32G X 6 MM MISC Use with Saxenda daily   Liraglutide -Weight Management (SAXENDA) 18 MG/3ML SOPN Inject 3 mg into the skin daily.   meloxicam (MOBIC) 15 MG tablet Take 15 mg by mouth daily as needed for pain.   Vitamin D, Ergocalciferol, (DRISDOL) 1.25 MG (50000 UNIT) CAPS capsule Take 1 capsule (50,000 Units total) by mouth every 7  (seven) days.     Allergies:   Aspirin and Pineapple   Social History   Socioeconomic History   Marital status: Single    Spouse name: Not on file   Number of children: Not on file   Years of education: Not on file   Highest education level: Not on file  Occupational History   Not on file  Tobacco Use   Smoking status: Every Day    Packs/day: 0.50    Years: 21.00    Total pack years: 10.50    Types: Cigarettes   Smokeless tobacco: Never  Vaping Use   Vaping Use: Former   Devices: years ago, used it 1 time  Substance and Sexual Activity   Alcohol use: No   Drug use: No   Sexual activity: Yes  Other Topics Concern   Not on file  Social History Narrative   Not on file   Social Determinants of Health   Financial Resource Strain: Not on file  Food Insecurity: Not on file  Transportation Needs: Not on file  Physical Activity: Not on file  Stress: Not on file  Social Connections: Not on file     Family History: The patient's family history includes Breast cancer in her mother; Cancer in her mother; Diabetes in her father; Heart disease in her father; Hypertension in her mother.  ROS:   Please see the history  of present illness.     All other systems reviewed and are negative.  EKGs/Labs/Other Studies Reviewed:    The following studies were reviewed today:   EKG:  EKG is ordered today.  EKG showed normal sinus rhythm, sinus arrhythmia, precordial T wave inversions.  Recent Labs: 01/18/2022: ALT 35; BUN 14; Creatinine, Ser 0.98; Hemoglobin 15.3; Platelets 232; Potassium 4.0; Sodium 140  Recent Lipid Panel    Component Value Date/Time   CHOL 190 01/18/2022 0934   TRIG 156 (H) 01/18/2022 0934   HDL 58 01/18/2022 0934   CHOLHDL 3.3 01/18/2022 0934   LDLCALC 105 (H) 01/18/2022 0934     Risk Assessment/Calculations:         Physical Exam:    VS:  BP (!) 130/90 (BP Location: Left Arm, Patient Position: Sitting, Cuff Size: Large)   Pulse 71   Ht 5\' 10"   (1.778 m)   Wt (!) 370 lb (167.8 kg)   SpO2 98%   BMI 53.09 kg/m     Wt Readings from Last 3 Encounters:  03/27/22 (!) 370 lb (167.8 kg)  01/18/22 (!) 379 lb 3.2 oz (172 kg)  08/04/21 (!) 365 lb 6.4 oz (165.7 kg)     GEN:  Well nourished, well developed in no acute distress HEENT: Normal NECK: No JVD; No carotid bruits CARDIAC: RRR, no murmurs, rubs, gallops RESPIRATORY:  Clear to auscultation without rales, wheezing or rhonchi  ABDOMEN: Soft, non-tender, non-distended MUSCULOSKELETAL:  No edema; No deformity  SKIN: Warm and dry NEUROLOGIC:  Alert and oriented x 3 PSYCHIATRIC:  Normal affect   ASSESSMENT:    1. Nonspecific abnormal electrocardiogram (ECG) (EKG)   2. Primary hypertension   3. Pure hypercholesterolemia   4. Smoking    PLAN:    In order of problems listed above:  Septal T wave inversion, nonspecific, nonacute.  Patient is asymptomatic.  No indication for additional testing, patient reassured. Hypertension, BP elevated today, usually controlled.  Did not take BP meds this morning.  Medication compliance advised.  Continue HCTZ, amlodipine. Hyperlipidemia, Continue Lipitor 10 mg daily. Current smoker, smoking cessation recommended.  Follow-up as needed     Medication Adjustments/Labs and Tests Ordered: Current medicines are reviewed at length with the patient today.  Concerns regarding medicines are outlined above.  Orders Placed This Encounter  Procedures   EKG 12-Lead     No orders of the defined types were placed in this encounter.    Patient Instructions  Medication Instructions:   Your physician recommends that you continue on your current medications as directed. Please refer to the Current Medication list given to you today.  *If you need a refill on your cardiac medications before your next appointment, please call your pharmacy*    Follow-Up: At [facility], you and your health needs are our priority.  As part of our continuing  mission to provide you with exceptional heart care, we have created designated Provider Care Teams.  These Care Teams include your primary Cardiologist (physician) and Advanced Practice Providers (APPs -  Physician Assistants and Nurse Practitioners) who all work together to provide you with the care you need, when you need it.  We recommend signing up for the patient portal called "MyChart".  Sign up information is provided on this After Visit Summary.  MyChart is used to connect with patients for Virtual Visits (Telemedicine).  Patients are able to view lab/test results, encounter notes, upcoming appointments, etc.  Non-urgent messages can be sent to your provider as  well.   To learn more about what you can do with MyChart, go to [email or web address].    Your next appointment:   Follow up as needed   The format for your next appointment:   In Person  Provider:   Debbe Odea, MD     Important Information About Sugar         Signed, Debbe Odea, MD  03/27/2022 9:18 AM    [facility]

## 2022-04-06 ENCOUNTER — Other Ambulatory Visit (HOSPITAL_COMMUNITY): Payer: Self-pay

## 2022-04-06 ENCOUNTER — Other Ambulatory Visit: Payer: Self-pay | Admitting: Nurse Practitioner

## 2022-04-06 DIAGNOSIS — E782 Mixed hyperlipidemia: Secondary | ICD-10-CM

## 2022-04-06 DIAGNOSIS — R7303 Prediabetes: Secondary | ICD-10-CM

## 2022-04-06 DIAGNOSIS — I10 Essential (primary) hypertension: Secondary | ICD-10-CM

## 2022-04-09 ENCOUNTER — Other Ambulatory Visit (HOSPITAL_COMMUNITY): Payer: Self-pay

## 2022-04-09 MED ORDER — AMLODIPINE BESYLATE 5 MG PO TABS
5.0000 mg | ORAL_TABLET | Freq: Every day | ORAL | 1 refills | Status: DC
  Filled 2022-04-09: qty 90, 90d supply, fill #0
  Filled 2022-07-25: qty 90, 90d supply, fill #1

## 2022-04-09 MED ORDER — ATORVASTATIN CALCIUM 10 MG PO TABS
10.0000 mg | ORAL_TABLET | Freq: Every day | ORAL | 2 refills | Status: DC
  Filled 2022-04-09: qty 60, 60d supply, fill #0
  Filled 2022-06-30: qty 60, 60d supply, fill #1
  Filled 2022-10-08: qty 60, 60d supply, fill #2

## 2022-04-09 MED ORDER — HYDROCHLOROTHIAZIDE 12.5 MG PO CAPS
12.5000 mg | ORAL_CAPSULE | Freq: Every day | ORAL | 1 refills | Status: DC
  Filled 2022-04-09: qty 90, 90d supply, fill #0
  Filled 2022-07-25: qty 90, 90d supply, fill #1

## 2022-04-11 ENCOUNTER — Ambulatory Visit: Payer: 59 | Admitting: Nurse Practitioner

## 2022-04-11 ENCOUNTER — Encounter: Payer: Self-pay | Admitting: Nurse Practitioner

## 2022-04-11 VITALS — BP 130/68 | HR 80 | Temp 97.9°F | Ht 70.0 in | Wt 364.2 lb

## 2022-04-11 DIAGNOSIS — R7303 Prediabetes: Secondary | ICD-10-CM | POA: Diagnosis not present

## 2022-04-11 DIAGNOSIS — I10 Essential (primary) hypertension: Secondary | ICD-10-CM

## 2022-04-11 DIAGNOSIS — G8929 Other chronic pain: Secondary | ICD-10-CM

## 2022-04-11 DIAGNOSIS — E782 Mixed hyperlipidemia: Secondary | ICD-10-CM | POA: Diagnosis not present

## 2022-04-11 DIAGNOSIS — E559 Vitamin D deficiency, unspecified: Secondary | ICD-10-CM | POA: Diagnosis not present

## 2022-04-11 DIAGNOSIS — Z6841 Body Mass Index (BMI) 40.0 and over, adult: Secondary | ICD-10-CM | POA: Diagnosis not present

## 2022-04-11 DIAGNOSIS — M25511 Pain in right shoulder: Secondary | ICD-10-CM | POA: Diagnosis not present

## 2022-04-11 NOTE — Progress Notes (Signed)
I,Ciera J Martin,acting as a scribe for Janece Moore, FNP.,have documented all relevant documentation on the behalf of Janece Moore, FNP,as directed by  Janece Moore, FNP while in the presence of Janece Moore, FNP.    Subjective:     Patient ID: Leslie Mendoza , female    DOB: 01/29/1981 , 41 y.o.   MRN: [id number]   Chief Complaint  Patient presents with   Weight Check    HPI  Patient is here for a weight check. She had stopped taking Saxenda for about one month after getting sick with a cold. Patient states compliance with her medication. She is walking more on the weekend with 10,000-15,000 steps.  She has increased her water intake.  She has been doing better about not eating out as much.  She has increased her vegetable intake.  She admits to having cravings for sweets. She is noticing a difference with her clothes are fitting more loosely. She continues with Saxenda 3 mg occasionally has nausea but not too "bad".   Patient states her shoulder has been hurting for months, she states its from lifting things. With sudden movements she will have a sharp pain and at night will have to keep her right arm down. She is having to lift her right forearm to raise her shoulder this morning. Pain has been ongoing for at least a year but is now occurring on a daily basis. 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 Denies taking any pain medications including a pain cream  Wt Readings from Last 3 Encounters: 04/11/22 : (!) 364 lb 3.2 oz (165.2 kg) 03/27/22 : (!) 370 lb (167.8 kg) 01/18/22 : (!) 379 lb 3.2 oz (172 kg)        Past Medical History:  Diagnosis Date   Achilles tendinitis    Hypertension      Family History  Problem Relation Age of Onset   Cancer Mother    Hypertension Mother    Breast cancer Mother    Heart disease Father    Diabetes Father      Current Outpatient Medications:    albuterol (VENTOLIN HFA) 108 (90 Base) MCG/ACT inhaler, Inhale 1-2 puffs into the lungs  every 6 (six) hours as needed for wheezing or shortness of breath., Disp: 18 g, Rfl: 0   amLODipine (NORVASC) 5 MG tablet, Take 1 tablet (5 mg total) by mouth daily., Disp: 90 tablet, Rfl: 1   atorvastatin (LIPITOR) 10 MG tablet, TAKE 1 TABLET BY MOUTH ONCE DAILY, Disp: 60 tablet, Rfl: 2   benzonatate (TESSALON) 100 MG capsule, Take 1 capsule (100 mg total) by mouth 3 (three) times daily as needed for cough., Disp: 21 capsule, Rfl: 0   hydrochlorothiazide (MICROZIDE) 12.5 MG capsule, Take 1 capsule by mouth daily., Disp: 90 capsule, Rfl: 1   Insulin Pen Needle (UNIFINE PENTIPS) 32G X 6 MM MISC, Use with Saxenda daily, Disp: 100 each, Rfl: 1   Liraglutide -Weight Management (SAXENDA) 18 MG/3ML SOPN, Inject 3 mg into the skin daily., Disp: 15 mL, Rfl: 1   meloxicam (MOBIC) 15 MG tablet, Take 15 mg by mouth daily as needed for pain., Disp: , Rfl:    Vitamin D, Ergocalciferol, (DRISDOL) 1.25 MG (50000 UNIT) CAPS capsule, Take 1 capsule (50,000 Units total) by mouth every 7 (seven) days., Disp: 12 capsule, Rfl: 1   Allergies  Allergen Reactions   Aspirin     rash   Pineapple     hives       Review of Systems  Constitutional: Negative.   HENT: Negative.    Eyes: Negative.   Respiratory: Negative.    Cardiovascular: Negative.   Gastrointestinal: Negative.   Psychiatric/Behavioral: Negative.       Today's Vitals   04/11/22 0833  BP: 130/68  Pulse: 80  Temp: 97.9 F (36.6 C)  TempSrc: Oral  Weight: (!) 364 lb 3.2 oz (165.2 kg)  Height: 5' 10" (1.778 m)  PainSc: 4   PainLoc: Shoulder   Body mass index is 52.26 kg/m.   Objective:  Physical Exam Vitals reviewed.  Constitutional:      General: She is not in acute distress.    Appearance: Normal appearance. She is obese.  Cardiovascular:     Rate and Rhythm: Normal rate and regular rhythm.     Pulses: Normal pulses.     Heart sounds: Normal heart sounds. No murmur heard. Pulmonary:     Effort: Pulmonary effort is normal. No  respiratory distress.     Breath sounds: Normal breath sounds. No wheezing.  Skin:    General: Skin is warm and dry.     Capillary Refill: Capillary refill takes less than 2 seconds.  Neurological:     General: No focal deficit present.     Mental Status: She is alert and oriented to person, place, and time.     Cranial Nerves: No cranial nerve deficit.     Motor: No weakness.  Psychiatric:        Mood and Affect: Mood normal.        Behavior: Behavior normal.        Thought Content: Thought content normal.        Judgment: Judgment normal.         Assessment And Plan:     1. Prediabetes Comments: HgbA1c was slightly elevated at 6.3 at last visit, continue focusin on a low sugar and starch diet.  - Hemoglobin A1c  2. Essential hypertension Comments: Blood pressure is fairly controlled, continue current medications. - BMP8+eGFR  3. Vitamin D deficiency Comments: Continue vitamin d supplement high dose - VITAMIN D 25 Hydroxy (Vit-D Deficiency, Fractures)  4. Mixed hyperlipidemia Comments: Cholesterol levels are stable, continue statin. Tolerating well.   5. Morbid obesity with BMI of 50.0-59.9, adult [facility]) Comments: Continue Saxenda and congratulated on her weight loss. Encouraged to continue exercising with a goal of at least 150 minutes a week  6. Chronic right shoulder pain Comments: Pain to posterior joint at bursa space, decreased ROM. Will refer to Orthopedics for further evaluation - Ambulatory referral to Orthopedic Surgery    Patient was given opportunity to ask questions. Patient verbalized understanding of the plan and was able to repeat key elements of the plan. All questions were answered to their satisfaction.  Minette Brine, FNP   I, Minette Brine, FNP, have reviewed all documentation for this visit. The documentation on 04/11/22 for the exam, diagnosis, procedures, and orders are all accurate and complete.   IF YOU HAVE BEEN REFERRED TO A SPECIALIST, IT  MAY TAKE 1-2 WEEKS TO SCHEDULE/PROCESS THE REFERRAL. IF YOU HAVE NOT HEARD FROM US/SPECIALIST IN TWO WEEKS, PLEASE GIVE [place] A CALL AT [phone].   THE PATIENT IS ENCOURAGED TO PRACTICE SOCIAL DISTANCING DUE TO THE COVID-19 PANDEMIC.

## 2022-04-11 NOTE — Patient Instructions (Addendum)
Shoulder Pain Many things can cause shoulder pain, including: An injury to the shoulder. Overuse of the shoulder. Arthritis. The source of the pain can be: Inflammation. An injury to the shoulder joint. An injury to a tendon, ligament, or bone. Follow these instructions at home: Pay attention to changes in your symptoms. Let your health care provider know about them. Follow these instructions to relieve your pain. If you have a sling: Wear the sling as told by your health care provider. Remove it only as told by your health care provider. Loosen the sling if your fingers tingle, become numb, or turn cold and blue. Keep the sling clean. If the sling is not waterproof: Do not let it get wet. Remove it to shower or bathe. Move your arm as little as possible, but keep your hand moving to prevent swelling. Managing pain, stiffness, and swelling  If directed, put ice on the painful area: Put ice in a plastic bag. Place a towel between your skin and the bag. Leave the ice on for 20 minutes, 2-3 times per day. Stop applying ice if it does not help with the pain. Squeeze a soft ball or a foam pad as much as possible. This helps to keep the shoulder from swelling. It also helps to strengthen the arm. General instructions Take over-the-counter and prescription medicines only as told by your health care provider. Keep all follow-up visits as told by your health care provider. This is important. Contact a health care provider if: Your pain gets worse. Your pain is not relieved with medicines. New pain develops in your arm, hand, or fingers. Get help right away if: Your arm, hand, or fingers: Tingle. Become numb. Become swollen. Become painful. Turn white or blue. Summary Shoulder pain can be caused by an injury, overuse, or arthritis. Pay attention to changes in your symptoms. Let your health care provider know about them. This condition may be treated with a sling, ice, and pain  medicines. Contact your health care provider if the pain gets worse or new pain develops. Get help right away if your arm, hand, or fingers tingle or become numb, swollen, or painful. Keep all follow-up visits as told by your health care provider. This is important. This information is not intended to replace advice given to you by your health care provider. Make sure you discuss any questions you have with your health care provider. Document Revised: 04/28/2021 Document Reviewed: 04/28/2021 Elsevier Patient Education  2023 Elsevier Inc.   Obesity, Adult Obesity is the condition of having too much total body fat. Being overweight or obese means that your weight is greater than what is considered healthy for your body size. Obesity is determined by a measurement called BMI (body mass index). BMI is an estimate of body fat and is calculated from height and weight. For adults, a BMI of 30 or higher is considered obese. Obesity can lead to other health concerns and major illnesses, including: Stroke. Coronary artery disease (CAD). Type 2 diabetes. Some types of cancer, including cancers of the colon, breast, uterus, and gallbladder. High blood pressure (hypertension). High cholesterol. Gallbladder stones. Obesity can also contribute to: Osteoarthritis. Sleep apnea. Infertility problems. What are the causes? Common causes of this condition include: Eating daily meals that are high in calories, sugar, and fat. Drinking high amounts of sugar-sweetened beverages, such as soft drinks. Being born with genes that may make you more likely to become obese. Having a medical condition that causes obesity, including: Hypothyroidism. Polycystic ovarian  syndrome (PCOS). Binge-eating disorder. Cushing syndrome. Taking certain medicines, such as steroids, antidepressants, and seizure medicines. Not being physically active (sedentary lifestyle). Not getting enough sleep. What increases the risk? The  following factors may make you more likely to develop this condition: Having a family history of obesity. Living in an area with limited access to: [place], recreation centers, or sidewalks. Healthy food choices, such as grocery stores and farmers' markets. What are the signs or symptoms? The main sign of this condition is having too much body fat. How is this diagnosed? This condition is diagnosed based on: Your BMI. If you are an adult with a BMI of 30 or higher, you are considered obese. Your waist circumference. This measures the distance around your waistline. Your skinfold thickness. Your health care provider may gently pinch a fold of your skin and measure it. You may have other tests to check for underlying conditions. How is this treated? Treatment for this condition often includes changing your lifestyle. Treatment may include some or all of the following: Dietary changes. This may include developing a healthy meal plan. Regular physical activity. This may include activity that causes your heart to beat faster (aerobic exercise) and strength training. Work with your health care provider to design an exercise program that works for you. Medicine to help you lose weight if you are unable to lose one pound a week after six weeks of healthy eating and more physical activity. Treating conditions that cause the obesity (underlying conditions). Surgery. Surgical options may include gastric banding and gastric bypass. Surgery may be done if: Other treatments have not helped to improve your condition. You have a BMI of 40 or higher. You have life-threatening health problems related to obesity. Follow these instructions at home: Eating and drinking  Follow recommendations from your health care provider about what you eat and drink. Your health care provider may advise you to: Limit fast food, sweets, and processed snack foods. Choose low-fat options, such as low-fat milk instead of whole  milk. Eat five or more servings of fruits or vegetables every day. Choose healthy foods when you eat out. Keep low-fat snacks available. Limit sugary drinks, such as soda, fruit juice, sweetened iced tea, and flavored milk. Drink enough water to keep your urine pale yellow. Do not follow a fad diet. Fad diets can be unhealthy and even dangerous. Other healthful choices include: Eat at home more often. This gives you more control over what you eat. Learn to read food labels. This will help you understand how much food is considered one serving. Learn what a healthy serving size is. Physical activity Exercise regularly, as told by your health care provider. Most adults should get up to 150 minutes of moderate-intensity exercise every week. Ask your health care provider what types of exercise are safe for you and how often you should exercise. Warm up and stretch before being active. Cool down and stretch after being active. Rest between periods of activity. Lifestyle Work with your health care provider and a dietitian to set a weight-loss goal that is healthy and reasonable for you. Limit your screen time. Find ways to reward yourself that do not involve food. Do not drink alcohol if: Your health care provider tells you not to drink. You are pregnant, may be pregnant, or are planning to become pregnant. If you drink alcohol: Limit how much you have to: 0-1 drink a day for women. 0-2 drinks a day for men. Know how much alcohol is in your  drink. In the [place] one drink equals one 12 oz bottle of beer (355 mL), one 5 oz glass of wine (148 mL), or one 1 oz glass of hard liquor (44 mL). General instructions Keep a weight-loss journal to keep track of the food you eat and how much exercise you get. Take over-the-counter and prescription medicines only as told by your health care provider. Take vitamins and supplements only as told by your health care provider. Consider joining a support  group. Your health care provider may be able to recommend a support group. Pay attention to your mental health as obesity can lead to depression or self esteem issues. Keep all follow-up visits. This is important. Contact a health care provider if: You are unable to meet your weight-loss goal after six weeks of dietary and lifestyle changes. You have trouble breathing. Summary Obesity is the condition of having too much total body fat. Being overweight or obese means that your weight is greater than what is considered healthy for your body size. Work with your health care provider and a dietitian to set a weight-loss goal that is healthy and reasonable for you. Exercise regularly, as told by your health care provider. Ask your health care provider what types of exercise are safe for you and how often you should exercise. This information is not intended to replace advice given to you by your health care provider. Make sure you discuss any questions you have with your health care provider. Document Revised: 03/21/2021 Document Reviewed: 03/21/2021 Elsevier Patient Education  2023 Elsevier Inc.  Encouraged to take tylenol or ibuprofen two times a day for 5 days or pain cream, use heating pad 1-2 times a day to shoulder.   Congratulations on your 15 lb weight loss, keep up the good work!!!

## 2022-04-12 LAB — BMP8+EGFR
BUN/Creatinine Ratio: 16 (ref 9–23)
BUN: 15 mg/dL (ref 6–24)
CO2: 23 mmol/L (ref 20–29)
Calcium: 9.6 mg/dL (ref 8.7–10.2)
Chloride: 102 mmol/L (ref 96–106)
Creatinine, Ser: 0.93 mg/dL (ref 0.57–1.00)
Glucose: 150 mg/dL — ABNORMAL HIGH (ref 70–99)
Potassium: 4 mmol/L (ref 3.5–5.2)
Sodium: 141 mmol/L (ref 134–144)
eGFR: 79 mL/min/{1.73_m2} (ref >59)

## 2022-04-12 LAB — VITAMIN D 25 HYDROXY (VIT D DEFICIENCY, FRACTURES): Vit D, 25-Hydroxy: 10.7 ng/mL — ABNORMAL LOW (ref 30.0–100.0)

## 2022-04-12 LAB — HEMOGLOBIN A1C
Est. average glucose Bld gHb Est-mCnc: 128 mg/dL
Hgb A1c MFr Bld: 6.1 % — ABNORMAL HIGH (ref 4.8–5.6)

## 2022-04-15 MED ORDER — VITAMIN D (ERGOCALCIFEROL) 1.25 MG (50000 UNIT) PO CAPS
50000.0000 [IU] | ORAL_CAPSULE | ORAL | 1 refills | Status: DC
  Filled 2022-04-15 – 2022-11-08 (×4): qty 24, 84d supply, fill #0
  Filled 2023-02-08 – 2023-02-22 (×2): qty 24, 84d supply, fill #1

## 2022-04-16 ENCOUNTER — Other Ambulatory Visit (HOSPITAL_COMMUNITY): Payer: Self-pay

## 2022-04-27 ENCOUNTER — Ambulatory Visit (INDEPENDENT_AMBULATORY_CARE_PROVIDER_SITE_OTHER): Payer: 59

## 2022-04-27 ENCOUNTER — Ambulatory Visit: Payer: 59 | Admitting: Sports Medicine

## 2022-04-27 ENCOUNTER — Encounter: Payer: Self-pay | Admitting: Sports Medicine

## 2022-04-27 VITALS — BP 145/83 | HR 74 | Ht 71.0 in | Wt 375.2 lb

## 2022-04-27 DIAGNOSIS — M25511 Pain in right shoulder: Secondary | ICD-10-CM

## 2022-04-27 DIAGNOSIS — G8929 Other chronic pain: Secondary | ICD-10-CM

## 2022-04-27 NOTE — Progress Notes (Signed)
Patient complaining of right shoulder pain going on for several months now. No reported injury. No radicular symptoms. Has not had any type of treatment for this shoulder. Pain is more severe in the mornings.

## 2022-04-27 NOTE — Progress Notes (Signed)
Office Visit Note   Patient: Leslie Mendoza           Date of Birth: 11-Aug-1981           MRN: [id number] Visit Date: 04/27/2022              Requested by: [name], FNP [address] [place],  [place] [address] PCP: [name], FNP   Assessment & Plan: Visit Diagnoses:  1. Chronic right shoulder pain    Plan: Discussed with Leslie Mendoza that based on her x-rays and her exam that her shoulder pain is likely rotator cuff in origin, however her strength testing is intact.  Her provocative symptoms indicate likely subscapularis, she could have a degree of impingement syndrome as well.  We will start with conservative treatment, my athletic trainer did review a home exercise series for her rotator cuff.  She is to perform this once daily.  She can use topical Voltaren gel and Tylenol as needed.  We did discuss further options such as subacromial injection therapy, however will hold for now.  She will follow-up in about 4-5 weeks after her home exercise program for reevaluation.  Orders:  Orders Placed This Encounter  Procedures   XR Shoulder Right   No orders of the defined types were placed in this encounter.  Subjective: Chief Complaint  Patient presents with   Right Shoulder - Pain    HPI Leslie Mendoza is a pleasant 41 year old female who presents today for evaluation of chronic right shoulder pain. She works at [employer]. Pain has been going on since about the beginning of this year.  She denies any specific injury.  She denies any numbness or tingling or radicular symptoms.  Her pain is worse in the mornings and with certain positions at night.  When she rolls over sometimes she will get a sharp pain that awakens her.  The pain is mostly specific to the lateral shoulder and proximal deltoid, it does not radiate.  She is not taking any medications (very occasional tylenol) nor has done any therapy or treatment to date.  She is prediabetic with her last A1c on  04/11/2022 of 6.1.  Objective: Vital Signs: BP (!) 145/83   Pulse 74   Ht 5\' 11"  (1.803 m)   Wt (!) 375 lb 3.2 oz (170.2 kg)   BMI 52.33 kg/m   Physical Exam Gen: Well-appearing, in no acute distress; non-toxic CV: Regular Rate. Well-perfused. Warm.  Resp: Breathing unlabored on room air; no wheezing. Psych: Fluid speech in conversation; appropriate affect; normal thought process Neuro: Sensation intact throughout. No gross coordination deficits.   Ortho Exam - Right shoulder:   Specialty Comments:  No specialty comments available.  Imaging: XR Shoulder Right  Result Date: 04/27/2022 3 views of the right shoulder ordered and reviewed by myself, including Grashey, axial and scapular Y views.  X-rays demonstrate some cortical spurring off the greater tuberosity of the humerus. There is moderate AC joint arthropathy with some joint space narrowing.  Humeral head is well located, otherwise no acute bony changes.    PMFS History: Patient Active Problem List   Diagnosis Date Noted   Class 3 severe obesity due to excess calories with body mass index (BMI) of 50.0 to 59.9 in adult [facility]) 12/04/2018   Prediabetes 10/06/2018   Heart palpitations 10/06/2018   Mixed hyperlipidemia 10/06/2018   Essential hypertension 10/06/2018   Vitamin D deficiency 10/06/2018   Past Medical History:  Diagnosis Date  Achilles tendinitis    Hypertension     Family History  Problem Relation Age of Onset   Cancer Mother    Hypertension Mother    Breast cancer Mother    Heart disease Father    Diabetes Father     Past Surgical History:  Procedure Laterality Date   CHOLECYSTECTOMY     Social History   Occupational History   Not on file  Tobacco Use   Smoking status: Every Day    Packs/day: 0.50    Years: 21.00    Total pack years: 10.50    Types: Cigarettes   Smokeless tobacco: Never   Tobacco comments:    04/11/22 - down to 5 cigarettes in the last month.   Vaping Use   Vaping Use:  Former   Devices: years ago, used it 1 time  Substance and Sexual Activity   Alcohol use: No   Drug use: No   Sexual activity: Yes

## 2022-04-27 NOTE — Patient Instructions (Signed)
Leslie Mendoza - It was great to see you today, thank you for letting me participate in your care!  Today, we discussed your right shoulder pain.  This seems most indicative of some irritation to the rotator cuff.   Things for you to do: -Perform the home exercise therapy every day once daily -May use topical Voltaren gel over the painful area -Ice/heat  You will follow-up with me in about 4-5 weeks if not improving.  If you have any further questions, please give the clinic a call [phone].  Dr. Madelyn Brunner

## 2022-05-18 ENCOUNTER — Other Ambulatory Visit (HOSPITAL_COMMUNITY): Payer: Self-pay

## 2022-05-18 MED ORDER — AMOXICILLIN 500 MG PO CAPS
500.0000 mg | ORAL_CAPSULE | Freq: Three times a day (TID) | ORAL | 0 refills | Status: AC
  Filled 2022-05-18: qty 21, 7d supply, fill #0

## 2022-05-21 ENCOUNTER — Other Ambulatory Visit (HOSPITAL_COMMUNITY): Payer: Self-pay

## 2022-05-21 ENCOUNTER — Other Ambulatory Visit: Payer: Self-pay | Admitting: Nurse Practitioner

## 2022-05-21 MED ORDER — SAXENDA 18 MG/3ML ~~LOC~~ SOPN
3.0000 mg | PEN_INJECTOR | Freq: Every day | SUBCUTANEOUS | 1 refills | Status: DC
  Filled 2022-05-21: qty 15, 30d supply, fill #0
  Filled 2022-06-30 – 2022-11-29 (×4): qty 15, 30d supply, fill #1

## 2022-05-22 ENCOUNTER — Other Ambulatory Visit (HOSPITAL_COMMUNITY): Payer: Self-pay

## 2022-05-23 ENCOUNTER — Other Ambulatory Visit (HOSPITAL_COMMUNITY): Payer: Self-pay

## 2022-05-25 ENCOUNTER — Ambulatory Visit: Payer: 59 | Admitting: Sports Medicine

## 2022-06-12 ENCOUNTER — Encounter: Payer: 59 | Admitting: Nurse Practitioner

## 2022-06-12 NOTE — Patient Instructions (Signed)

## 2022-06-12 NOTE — Progress Notes (Signed)
No show

## 2022-07-02 ENCOUNTER — Other Ambulatory Visit (HOSPITAL_COMMUNITY): Payer: Self-pay

## 2022-07-12 ENCOUNTER — Other Ambulatory Visit (HOSPITAL_COMMUNITY): Payer: Self-pay

## 2022-07-20 ENCOUNTER — Other Ambulatory Visit (HOSPITAL_COMMUNITY): Payer: Self-pay

## 2022-07-25 ENCOUNTER — Other Ambulatory Visit (HOSPITAL_COMMUNITY): Payer: Self-pay

## 2022-07-26 ENCOUNTER — Other Ambulatory Visit (HOSPITAL_COMMUNITY): Payer: Self-pay

## 2022-08-22 ENCOUNTER — Other Ambulatory Visit (HOSPITAL_COMMUNITY): Payer: Self-pay

## 2022-09-01 ENCOUNTER — Other Ambulatory Visit (HOSPITAL_COMMUNITY): Payer: Self-pay

## 2022-10-09 ENCOUNTER — Other Ambulatory Visit: Payer: Self-pay

## 2022-10-11 ENCOUNTER — Other Ambulatory Visit (HOSPITAL_COMMUNITY): Payer: Self-pay

## 2022-10-13 ENCOUNTER — Other Ambulatory Visit (HOSPITAL_COMMUNITY): Payer: Self-pay

## 2022-11-08 ENCOUNTER — Other Ambulatory Visit: Payer: Self-pay | Admitting: Nurse Practitioner

## 2022-11-08 ENCOUNTER — Other Ambulatory Visit (HOSPITAL_COMMUNITY): Payer: Self-pay

## 2022-11-08 DIAGNOSIS — I10 Essential (primary) hypertension: Secondary | ICD-10-CM

## 2022-11-09 ENCOUNTER — Other Ambulatory Visit (HOSPITAL_COMMUNITY): Payer: Self-pay

## 2022-11-09 MED ORDER — HYDROCHLOROTHIAZIDE 12.5 MG PO CAPS
12.5000 mg | ORAL_CAPSULE | Freq: Every day | ORAL | 1 refills | Status: DC
  Filled 2022-11-09: qty 90, 90d supply, fill #0
  Filled 2023-02-08 – 2023-02-22 (×2): qty 90, 90d supply, fill #1

## 2022-11-09 MED ORDER — AMLODIPINE BESYLATE 5 MG PO TABS
5.0000 mg | ORAL_TABLET | Freq: Every day | ORAL | 1 refills | Status: DC
  Filled 2022-11-09: qty 90, 90d supply, fill #0
  Filled 2023-02-08 – 2023-02-22 (×2): qty 90, 90d supply, fill #1

## 2022-11-12 ENCOUNTER — Other Ambulatory Visit (HOSPITAL_COMMUNITY): Payer: Self-pay

## 2022-11-19 ENCOUNTER — Other Ambulatory Visit (HOSPITAL_COMMUNITY): Payer: Self-pay

## 2022-11-19 ENCOUNTER — Telehealth: Payer: Self-pay

## 2022-11-19 NOTE — Telephone Encounter (Signed)
Per pharmacy [facility] requires PA  PA started via [place]: [id number]  Returned by [employer] with the following notice: Message from Plan This drug/product is not covered under the pharmacy benefit. Prior Authorization is not available.  Spoke with [employer] and they state they are having a multitude of issues with [employer] rejecting these claims before the cut off date of December 22, 2022.  Spoke with Caren Griffins at [facility] and she has started a new PA. [phone]  REF# [id number]

## 2022-11-22 ENCOUNTER — Other Ambulatory Visit (HOSPITAL_COMMUNITY): Payer: Self-pay

## 2022-11-22 NOTE — Telephone Encounter (Signed)
Received letter 11/21/22 from [employer] stating Leslie Mendoza is again denied due to not being a covered medication on the plan.   LVM for patient to return call to advise

## 2022-11-23 ENCOUNTER — Other Ambulatory Visit: Payer: Self-pay

## 2022-11-29 ENCOUNTER — Other Ambulatory Visit (HOSPITAL_COMMUNITY): Payer: Self-pay

## 2022-12-20 ENCOUNTER — Other Ambulatory Visit: Payer: Self-pay | Admitting: Nurse Practitioner

## 2022-12-20 DIAGNOSIS — R7303 Prediabetes: Secondary | ICD-10-CM

## 2022-12-20 DIAGNOSIS — E782 Mixed hyperlipidemia: Secondary | ICD-10-CM

## 2022-12-21 ENCOUNTER — Other Ambulatory Visit (HOSPITAL_COMMUNITY): Payer: Self-pay

## 2022-12-21 MED ORDER — ATORVASTATIN CALCIUM 10 MG PO TABS
10.0000 mg | ORAL_TABLET | Freq: Every day | ORAL | 2 refills | Status: DC
Start: 2022-12-21 — End: 2023-06-06
  Filled 2022-12-21: qty 60, 60d supply, fill #0
  Filled 2023-02-22: qty 60, 60d supply, fill #1
  Filled 2023-05-10 – 2023-05-13 (×2): qty 60, 60d supply, fill #2

## 2023-01-30 ENCOUNTER — Encounter: Payer: 59 | Admitting: Nurse Practitioner

## 2023-02-18 ENCOUNTER — Other Ambulatory Visit (HOSPITAL_COMMUNITY): Payer: Self-pay

## 2023-02-22 ENCOUNTER — Other Ambulatory Visit (HOSPITAL_COMMUNITY): Payer: Self-pay

## 2023-02-23 ENCOUNTER — Other Ambulatory Visit (HOSPITAL_COMMUNITY): Payer: Self-pay

## 2023-03-21 ENCOUNTER — Other Ambulatory Visit (HOSPITAL_COMMUNITY)
Admission: RE | Admit: 2023-03-21 | Discharge: 2023-03-21 | Disposition: A | Discharge status: Home / Self Care | Payer: Commercial Managed Care - PPO | Source: Ambulatory Visit | Attending: Obstetrics & Gynecology | Admitting: Obstetrics & Gynecology

## 2023-03-21 ENCOUNTER — Ambulatory Visit: Payer: Commercial Managed Care - PPO | Admitting: Obstetrics & Gynecology

## 2023-03-21 ENCOUNTER — Encounter: Payer: Self-pay | Admitting: Obstetrics & Gynecology

## 2023-03-21 VITALS — BP 145/92 | HR 66 | Ht 70.0 in | Wt 386.0 lb

## 2023-03-21 DIAGNOSIS — R7309 Other abnormal glucose: Secondary | ICD-10-CM | POA: Diagnosis not present

## 2023-03-21 DIAGNOSIS — E559 Vitamin D deficiency, unspecified: Secondary | ICD-10-CM | POA: Diagnosis not present

## 2023-03-21 DIAGNOSIS — Z01419 Encounter for gynecological examination (general) (routine) without abnormal findings: Secondary | ICD-10-CM | POA: Diagnosis not present

## 2023-03-21 DIAGNOSIS — Z1231 Encounter for screening mammogram for malignant neoplasm of breast: Secondary | ICD-10-CM | POA: Diagnosis not present

## 2023-03-21 NOTE — Progress Notes (Signed)
GYNECOLOGY ANNUAL PREVENTATIVE CARE ENCOUNTER NOTE  History:     Leslie Mendoza is a 42 y.o. G0 female here for a routine annual gynecologic exam.  Current complaints: has a lesion on her mons pubis she wants evaluated. Sometimes, rubs on her underwear and can bleed.   No sexual activity for over 2 years.  Denies abnormal vaginal bleeding, discharge, pelvic pain or other gynecologic concerns.  Of note, patient works as a Engineer, mining at [employer].    Gynecologic History No LMP recorded. (Menstrual status: Other). Contraception: abstinence Last Pap: 01/05/2019. Result was normal with negative HPV Last Mammogram: 03/22/2021.  Result was normal  Obstetric History OB History  No obstetric history on file.    Past Medical History:  Diagnosis Date   Achilles tendinitis    Hypertension     Past Surgical History:  Procedure Laterality Date   CHOLECYSTECTOMY      Current Outpatient Medications on File Prior to Visit  Medication Sig Dispense Refill   albuterol (VENTOLIN HFA) 108 (90 Base) MCG/ACT inhaler Inhale 1-2 puffs into the lungs every 6 (six) hours as needed for wheezing or shortness of breath. 18 g 0   amLODipine (NORVASC) 5 MG tablet Take 1 tablet (5 mg total) by mouth daily. 90 tablet 1   atorvastatin (LIPITOR) 10 MG tablet TAKE 1 TABLET BY MOUTH ONCE DAILY 60 tablet 2   hydrochlorothiazide (MICROZIDE) 12.5 MG capsule Take 1 capsule by mouth daily. 90 capsule 1   Insulin Pen Needle (UNIFINE PENTIPS) 32G X 6 MM MISC Use with [facility] daily 100 each 1   Liraglutide -Weight Management (SAXENDA) 18 MG/3ML SOPN Inject 3 mg into the skin daily. 15 mL 1   meloxicam (MOBIC) 15 MG tablet Take 15 mg by mouth daily as needed for pain.     Vitamin D, Ergocalciferol, (DRISDOL) 1.25 MG (50000 UNIT) CAPS capsule Take 1 capsule by mouth 2 times a week. 24 capsule 1   benzonatate (TESSALON) 100 MG capsule Take 1 capsule (100 mg total) by mouth 3 (three) times daily as needed for cough.  21 capsule 0   No current facility-administered medications on file prior to visit.    Allergies  Allergen Reactions   Aspirin     rash   Pineapple     hives    Social History:  reports that she has been smoking cigarettes. She has a 10.5 pack-year smoking history. She has never used smokeless tobacco. She reports that she does not drink alcohol and does not use drugs.  Family History  Problem Relation Age of Onset   Cancer Mother    Hypertension Mother    Breast cancer Mother    Heart disease Father    Diabetes Father     The following portions of the patient's history were reviewed and updated as appropriate: allergies, current medications, past family history, past medical history, past social history, past surgical history and problem list.  Review of Systems Pertinent items noted in HPI and remainder of comprehensive ROS otherwise negative.  Physical Exam:  BP (!) 145/92   Pulse 66   Ht 5\' 10"  (1.778 m)   Wt (!) 386 lb (175.1 kg)   BMI 55.39 kg/m  Vitals:   03/21/23 0919 03/21/23 0953  BP: (!) 145/94 (!) 145/92    CONSTITUTIONAL: Well-developed, well-nourished female in no acute distress.  HENT:  Normocephalic, atraumatic, External right and left ear normal.  EYES: Conjunctivae and EOM are normal. Pupils are equal,  round, and reactive to light. No scleral icterus.  NECK: Normal range of motion, supple, no masses.  Normal thyroid.  SKIN: Skin is warm and dry. No rash noted. Not diaphoretic. No erythema. No pallor. MUSCULOSKELETAL: Normal range of motion. No tenderness.  No cyanosis, clubbing, or edema.   NEUROLOGIC: Alert and oriented to person, place, and time. Normal reflexes, muscle tone coordination. No cranial nerve deficit noted. PSYCHIATRIC: Normal mood and affect. Normal behavior. Normal judgment and thought content. CARDIOVASCULAR: Normal heart rate noted, regular rhythm RESPIRATORY: Clear to auscultation bilaterally. Effort and breath sounds normal, no  problems with respiration noted. BREASTS: Symmetric in size. No masses, tenderness, skin changes, nipple drainage, or lymphadenopathy bilaterally.  Performed in the presence of a chaperone. ABDOMEN: Soft, obese, no distention appreciated.  No tenderness, rebound or guarding.  PELVIC: Small skin tag on a broad stalk note don left side of mons pubis. No erythema, tenderness. Otherwise, normal appearing external genitalia and urethral meatus; normal appearing vaginal mucosa and cervix.  Normal appearing discharge.  Pap smear obtained; had to use large Graves.  Unable to palpate uterus or adnexa secondary to habitus.  Performed in the presence of a chaperone.   Assessment and Plan:     1. Vitamin D deficiency - VITAMIN D 25 Hydroxy (Vit-D Deficiency, Fractures) checked today, will follow up results and manage accordingly.  2. Breast cancer screening by mammogram Normal breast examination today, she was advised to perform periodic self breast examinations.  Mammogram scheduled for breast cancer screening. - MM 3D SCREENING MAMMOGRAM BILATERAL BREAST; Future  3. Well woman exam with routine gynecological exam - Cytology - PAP - CBC - Comprehensive metabolic panel - Lipid panel - Hemoglobin A1c - TSH Rfx on Abnormal to Free T4 Will follow up results of pap smear and preventative healthcare maintenance labs and manage accordingly. Offered removal of skin tag, she declines for now.  Will follow up with PCP about management of the HTN. Routine preventative health maintenance measures emphasized. Please refer to After Visit Summary for other counseling recommendations.      Jaynie Collins, MD, FACOG Obstetrician & Gynecologist, [facility] for [employer], [facility]

## 2023-03-22 ENCOUNTER — Other Ambulatory Visit (HOSPITAL_COMMUNITY): Payer: Self-pay

## 2023-03-22 DIAGNOSIS — R7309 Other abnormal glucose: Secondary | ICD-10-CM | POA: Insufficient documentation

## 2023-03-22 LAB — COMPREHENSIVE METABOLIC PANEL
Calcium: 9.9 mg/dL (ref 8.7–10.2)
Glucose: 95 mg/dL (ref 70–99)

## 2023-03-22 LAB — HEMOGLOBIN A1C: Est. average glucose Bld gHb Est-mCnc: 140 mg/dL

## 2023-03-22 LAB — LIPID PANEL: Triglycerides: 87 mg/dL (ref 0–149)

## 2023-03-22 MED ORDER — VITAMIN D (ERGOCALCIFEROL) 50000 UNITS PO CAPS
1.0000 | ORAL_CAPSULE | ORAL | 1 refills | Status: DC
  Filled 2023-03-22 – 2023-05-10 (×2): qty 12, 84d supply, fill #0

## 2023-03-22 NOTE — Addendum Note (Signed)
Addended by: Jaynie Collins A on: 03/22/2023 07:57 AM   Modules accepted: Orders

## 2023-04-03 ENCOUNTER — Ambulatory Visit
Admission: RE | Admit: 2023-04-03 | Discharge: 2023-04-03 | Disposition: A | Discharge status: Home / Self Care | Payer: Commercial Managed Care - PPO | Source: Ambulatory Visit | Attending: Obstetrics & Gynecology | Admitting: Obstetrics & Gynecology

## 2023-04-03 DIAGNOSIS — Z1231 Encounter for screening mammogram for malignant neoplasm of breast: Secondary | ICD-10-CM | POA: Diagnosis not present

## 2023-05-01 ENCOUNTER — Ambulatory Visit
Admission: RE | Admit: 2023-05-01 | Discharge: 2023-05-01 | Disposition: A | Discharge status: Home / Self Care | Payer: Commercial Managed Care - PPO | Source: Ambulatory Visit | Attending: Emergency Medicine | Admitting: Emergency Medicine

## 2023-05-01 VITALS — BP 124/85 | HR 93 | Temp 98.9°F | Resp 18

## 2023-05-01 DIAGNOSIS — R0602 Shortness of breath: Secondary | ICD-10-CM | POA: Diagnosis present

## 2023-05-01 DIAGNOSIS — F17200 Nicotine dependence, unspecified, uncomplicated: Secondary | ICD-10-CM | POA: Diagnosis not present

## 2023-05-01 DIAGNOSIS — U071 COVID-19: Secondary | ICD-10-CM | POA: Insufficient documentation

## 2023-05-01 DIAGNOSIS — B349 Viral infection, unspecified: Secondary | ICD-10-CM

## 2023-05-01 NOTE — ED Provider Notes (Signed)
[name]    CSN: [id number] Arrival date & time: 05/01/23  1739      History   Chief Complaint Chief Complaint  Patient presents with   Nasal Congestion    I feel like I have a bad cold. I have nasal congestion, somewhat runny nose, no aches and pains though and no fever. Just want to get checked out, hopefully put on an antibiotic or something. - Entered by patient    HPI Leslie Mendoza is a 42 y.o. female.   42 year old female pt, Leslie Mendoza, presents to urgent care for evaluation of nasal congestion x 3 days. Pt works at [employer]. Pt has been taking otc meds for symptom management.  The history is provided by the patient. No language interpreter was used.     Past Medical History:  Diagnosis Date   Achilles tendinitis    Hypertension     Patient Active Problem List   Diagnosis Date Noted   Nonspecific syndrome suggestive of viral illness 05/01/2023   Smoker 05/01/2023   Elevated hemoglobin A1c 03/22/2023   Class 3 severe obesity due to excess calories with body mass index (BMI) of 50.0 to 59.9 in adult [facility]) 12/04/2018   Prediabetes 10/06/2018   Heart palpitations 10/06/2018   Mixed hyperlipidemia 10/06/2018   Essential hypertension 10/06/2018   Vitamin D deficiency 10/06/2018    Past Surgical History:  Procedure Laterality Date   CHOLECYSTECTOMY      OB History   No obstetric history on file.      Home Medications    Prior to Admission medications   Medication Sig Start Date End Date Taking? Authorizing Provider  albuterol (VENTOLIN HFA) 108 (90 Base) MCG/ACT inhaler Inhale 1-2 puffs into the lungs every 6 (six) hours as needed for wheezing or shortness of breath. 03/10/22   Mickie Bail, NP  amLODipine (NORVASC) 5 MG tablet Take 1 tablet (5 mg total) by mouth daily. 11/09/22   [name], FNP  atorvastatin (LIPITOR) 10 MG tablet TAKE 1 TABLET BY MOUTH ONCE DAILY 12/21/22   [name], FNP  hydrochlorothiazide  (MICROZIDE) 12.5 MG capsule Take 1 capsule by mouth daily. 11/09/22   [name], FNP  Insulin Pen Needle (UNIFINE PENTIPS) 32G X 6 MM MISC Use with [facility] daily 01/19/22   [name], FNP  Liraglutide -Weight Management (SAXENDA) 18 MG/3ML SOPN Inject 3 mg into the skin daily. Patient not taking: Reported on 05/01/2023 05/21/22   [name], FNP  meloxicam (MOBIC) 15 MG tablet Take 15 mg by mouth daily as needed for pain.    [provider]  Vitamin D, Ergocalciferol, 50000 units CAPS Take 1 capsule by mouth once a week. 03/22/23   Tereso Newcomer, MD    Family History Family History  Problem Relation Age of Onset   Cancer Mother    Hypertension Mother    Breast cancer Mother    Heart disease Father    Diabetes Father     Social History Social History   Tobacco Use   Smoking status: Every Day    Current packs/day: 0.50    Average packs/day: 0.5 packs/day for 21.0 years (10.5 ttl pk-yrs)    Types: Cigarettes   Smokeless tobacco: Never   Tobacco comments:    04/11/22 - down to 5 cigarettes in the last month.   Vaping Use   Vaping status: Former   Devices: years ago, used it 1 time  Substance Use Topics  Alcohol use: No   Drug use: No     Allergies   Aspirin and Pineapple   Review of Systems Review of Systems  HENT:  Positive for congestion.   Respiratory:  Positive for cough and shortness of breath. Negative for wheezing.   All other systems reviewed and are negative.    Physical Exam Triage Vital Signs ED Triage Vitals [05/01/23 1827]  Encounter Vitals Group     BP      Systolic BP Percentile      Diastolic BP Percentile      Pulse      Resp      Temp      Temp src      SpO2      Weight      Height      Head Circumference      Peak Flow      Pain Score 0     Pain Loc      Pain Education      Exclude from Growth Chart    No data found.  Updated Vital Signs BP 124/85   Pulse 93   Temp 98.9 F (37.2 C)   Resp 18   SpO2 98%    Visual Acuity Right Eye Distance:   Left Eye Distance:   Bilateral Distance:    Right Eye Near:   Left Eye Near:    Bilateral Near:     Physical Exam   UC Treatments / Results  Labs (all labs ordered are listed, but only abnormal results are displayed) Labs Reviewed  SARS CORONAVIRUS 2 (TAT 6-24 HRS)    EKG   Radiology No results found.  Procedures Procedures (including critical care time)  Medications Ordered in UC Medications - No data to display  Initial Impression / Assessment and Plan / UC Course  I have reviewed the triage vital signs and the nursing notes.  Pertinent labs & imaging results that were available during my care of the patient were reviewed by me and considered in my medical decision making (see chart for details).    Discussed exam findings and plan of care with patient, strict go to ER precautions given.   Pt is not candidate for paxlovid with current medications, not interested in other antivirals. Check my chart for results. Patient verbalized understanding to this provider.  Ddx: Viral illness,smoker, URI,allergies Final Clinical Impressions(s) / UC Diagnoses   Final diagnoses:  Nonspecific syndrome suggestive of viral illness  Smoker     Discharge Instructions      Most likely you have a viral illness: no antibiotic as indicated at this time, May treat with OTC meds of choice(Coricidin HBP, chloraseptic). Make sure to drink plenty of fluids to stay hydrated(gatorade, water, popsicles,jello,etc), avoid caffeine products. Follow up with PCP. Return as needed. Stop smoking.     ED Prescriptions   None    PDMP not reviewed this encounter.   Clancy Gourd, NP 05/01/23 [facility] Moment

## 2023-05-01 NOTE — Discharge Instructions (Addendum)
Most likely you have a viral illness: no antibiotic as indicated at this time, May treat with OTC meds of choice(Coricidin HBP, chloraseptic). Make sure to drink plenty of fluids to stay hydrated(gatorade, water, popsicles,jello,etc), avoid caffeine products. Follow up with PCP. Return as needed. Stop smoking.

## 2023-05-01 NOTE — ED Triage Notes (Signed)
Patient to Urgent Care with complaints of nasal congestion/ cough. Poor sleep. Denies any known fevers.   Symptoms started Sunday. Reports she is having to sit up to sleep at night d/t SHOB.   Taking Dayquil/ Nyquil.

## 2023-05-02 LAB — SARS CORONAVIRUS 2 (TAT 6-24 HRS): SARS Coronavirus 2: POSITIVE — AB

## 2023-05-10 ENCOUNTER — Other Ambulatory Visit (HOSPITAL_COMMUNITY): Payer: Self-pay

## 2023-05-13 ENCOUNTER — Other Ambulatory Visit: Payer: Self-pay

## 2023-05-14 ENCOUNTER — Other Ambulatory Visit: Payer: Self-pay | Admitting: Nurse Practitioner

## 2023-05-14 DIAGNOSIS — I10 Essential (primary) hypertension: Secondary | ICD-10-CM

## 2023-06-05 NOTE — Progress Notes (Signed)
[name], CMA,acting as a Neurosurgeon for Leslie Felts, FNP.,have documented all relevant documentation on the behalf of Leslie Felts, FNP,as directed by  Leslie Felts, FNP while in the presence of Leslie Felts, FNP.  Subjective:  Patient ID: Leslie Mendoza , female    DOB: May 17, 1981 , 42 y.o.   MRN: [id number]  No chief complaint on file.   HPI  Patient presents today for a follow up after having covid, Patient reports compliance with medication. Patient denies any chest pain, SOB, or headaches. Patient has no concerns today. Patient reports she had covid the first week of September, she reports she is feeling better. She did not have any treatment. She went to Urgent care and was seen, started with a sore throat and then had stuffy nose, fatigue. She stayed home and took OTC medications and went back to work the following Monday. She is back in school - [employer].   When she is reaching for objects she has a pull to her left chest. Has been ongoing for 2 months.   BP Readings from Last 3 Encounters: 06/06/23 : 130/74 05/01/23 : 124/85 03/21/23 : Marland Kitchen 145/92       Past Medical History:  Diagnosis Date   Achilles tendinitis    Hypertension      Family History  Problem Relation Age of Onset   Cancer Mother    Hypertension Mother    Breast cancer Mother    Heart disease Father    Diabetes Father      Current Outpatient Medications:    albuterol (VENTOLIN HFA) 108 (90 Base) MCG/ACT inhaler, Inhale 1-2 puffs into the lungs every 6 (six) hours as needed for wheezing or shortness of breath., Disp: 18 g, Rfl: 0   meloxicam (MOBIC) 15 MG tablet, Take 15 mg by mouth daily as needed for pain., Disp: , Rfl:    Vitamin D, Ergocalciferol, 50000 units CAPS, Take 1 capsule by mouth once a week., Disp: 12 capsule, Rfl: 1   amLODipine (NORVASC) 5 MG tablet, Take 1 tablet (5 mg total) by mouth daily., Disp: 90 tablet, Rfl: 1   atorvastatin (LIPITOR) 10 MG tablet, TAKE 1 TABLET  BY MOUTH ONCE DAILY, Disp: 90 tablet, Rfl: 1   hydrochlorothiazide (MICROZIDE) 12.5 MG capsule, Take 1 capsule by mouth daily., Disp: 90 capsule, Rfl: 1   Allergies  Allergen Reactions   Aspirin     rash   Pineapple     hives     Review of Systems  Constitutional: Negative.   HENT: Negative.    Eyes: Negative.   Respiratory: Negative.    Cardiovascular: Negative.   Gastrointestinal: Negative.   Neurological: Negative.   Psychiatric/Behavioral: Negative.       Today's Vitals   06/06/23 0842  BP: 130/74  Pulse: 85  Temp: 98 F (36.7 C)  TempSrc: Oral  Weight: (!) 384 lb 3.2 oz (174.3 kg)  Height: 5\' 10"  (1.778 m)  PainSc: 0-No pain   Body mass index is 55.13 kg/m.  Wt Readings from Last 3 Encounters:  06/06/23 (!) 384 lb 3.2 oz (174.3 kg)  03/21/23 (!) 386 lb (175.1 kg)  04/27/22 (!) 375 lb 3.2 oz (170.2 kg)     Objective:  Physical Exam Vitals reviewed.  Constitutional:      General: She is not in acute distress.    Appearance: Normal appearance. She is obese.  Cardiovascular:     Rate and Rhythm: Normal rate and regular rhythm.  Pulses: Normal pulses.     Heart sounds: Normal heart sounds. No murmur heard. Pulmonary:     Effort: Pulmonary effort is normal. No respiratory distress.     Breath sounds: Normal breath sounds. No wheezing.  Skin:    General: Skin is warm and dry.     Capillary Refill: Capillary refill takes less than 2 seconds.  Neurological:     General: No focal deficit present.     Mental Status: She is alert and oriented to person, place, and time.     Cranial Nerves: No cranial nerve deficit.     Motor: No weakness.  Psychiatric:        Mood and Affect: Mood normal.        Behavior: Behavior normal.        Thought Content: Thought content normal.        Judgment: Judgment normal.         Assessment And Plan:  Prediabetes Assessment & Plan: HgbA1c is stable. Continue focusing on healthy diet and regular exercise.    Orders: -     Hemoglobin A1c -     Atorvastatin Calcium; TAKE 1 TABLET BY MOUTH ONCE DAILY  Dispense: 90 tablet; Refill: 1 -     CMP14+EGFR  Essential hypertension Assessment & Plan: Blood pressure is controlled, continue current medications.   Orders: -     amLODIPine Besylate; Take 1 tablet (5 mg total) by mouth daily.  Dispense: 90 tablet; Refill: 1 -     hydroCHLOROthiazide; Take 1 capsule by mouth daily.  Dispense: 90 capsule; Refill: 1 -     CMP14+EGFR  Mixed hyperlipidemia Assessment & Plan: Cholesterol levels are stable.   Orders: -     Lipid panel -     Atorvastatin Calcium; TAKE 1 TABLET BY MOUTH ONCE DAILY  Dispense: 90 tablet; Refill: 1 -     CMP14+EGFR  Vitamin D deficiency Assessment & Plan: Will check vitamin D level and supplement as needed.    Also encouraged to spend 15 minutes in the sun daily.    Orders: -     VITAMIN D 25 Hydroxy (Vit-D Deficiency, Fractures)  Influenza vaccination declined Assessment & Plan: Patient declined influenza vaccination at this time. Patient is aware that influenza vaccine prevents illness in 70% of healthy people, and reduces hospitalizations to 30-70% in elderly. This vaccine is recommended annually. Education has been provided regarding the importance of this vaccine but patient still declined. Advised may receive this vaccine at local pharmacy or Health Dept.or vaccine clinic. Aware to provide a copy of the vaccination record if obtained from local pharmacy or Health Dept.  Pt is willing to accept risk associated with refusing vaccination.    COVID-19 vaccination declined Assessment & Plan: Declines covid 19 vaccine. Discussed risk of covid 96 and if she changes her mind about the vaccine to call the office. Education has been provided regarding the importance of this vaccine but patient still declined. Advised may receive this vaccine at local pharmacy or Health Dept.or vaccine clinic. Aware to provide a copy of the  vaccination record if obtained from local pharmacy or Health Dept.  Encouraged to take multivitamin, vitamin d, vitamin c and zinc to increase immune system. Aware can call office if would like to have vaccine here at office. Verbalized acceptance and understanding.    Class 3 severe obesity due to excess calories with body mass index (BMI) of 50.0 to 59.9 in adult, unspecified whether serious comorbidity present (HCC)  Assessment & Plan: She is encouraged to strive for BMI less than 30 to decrease cardiac risk. Advised to aim for at least 150 minutes of exercise per week. No longer on weight loss medications.    Chest discomfort Assessment & Plan: She has been having ongoing chest discomfort.  EKG: Incomplete left bundle branch block.  -Decreasing R-wave progression -may be secondary to conduction defect  consider old anterior infarct. HR 74, I will refer to cardiology due to the bundle branch block this is a change   Orders: -     EKG 12-Lead -     Ambulatory referral to Cardiology  Abnormal EKG -     Ambulatory referral to Cardiology    No follow-ups on file.  Patient was given opportunity to ask questions. Patient verbalized understanding of the plan and was able to repeat key elements of the plan. All questions were answered to their satisfaction.    Jeanell Sparrow, FNP, have reviewed all documentation for this visit. The documentation on 06/06/23 for the exam, diagnosis, procedures, and orders are all accurate and complete.   IF YOU HAVE BEEN REFERRED TO A SPECIALIST, IT MAY TAKE 1-2 WEEKS TO SCHEDULE/PROCESS THE REFERRAL. IF YOU HAVE NOT HEARD FROM US/SPECIALIST IN TWO WEEKS, PLEASE GIVE [place] A CALL AT [phone].

## 2023-06-06 ENCOUNTER — Other Ambulatory Visit (HOSPITAL_COMMUNITY): Payer: Self-pay

## 2023-06-06 ENCOUNTER — Ambulatory Visit: Payer: Commercial Managed Care - PPO | Admitting: Nurse Practitioner

## 2023-06-06 VITALS — BP 130/74 | HR 85 | Temp 98.0°F | Ht 70.0 in | Wt 384.2 lb

## 2023-06-06 DIAGNOSIS — Z2821 Immunization not carried out because of patient refusal: Secondary | ICD-10-CM

## 2023-06-06 DIAGNOSIS — Z6841 Body Mass Index (BMI) 40.0 and over, adult: Secondary | ICD-10-CM

## 2023-06-06 DIAGNOSIS — R9431 Abnormal electrocardiogram [ECG] [EKG]: Secondary | ICD-10-CM

## 2023-06-06 DIAGNOSIS — E782 Mixed hyperlipidemia: Secondary | ICD-10-CM

## 2023-06-06 DIAGNOSIS — R7303 Prediabetes: Secondary | ICD-10-CM | POA: Diagnosis not present

## 2023-06-06 DIAGNOSIS — E66813 Obesity, class 3: Secondary | ICD-10-CM

## 2023-06-06 DIAGNOSIS — E559 Vitamin D deficiency, unspecified: Secondary | ICD-10-CM | POA: Diagnosis not present

## 2023-06-06 DIAGNOSIS — R0789 Other chest pain: Secondary | ICD-10-CM

## 2023-06-06 DIAGNOSIS — I10 Essential (primary) hypertension: Secondary | ICD-10-CM

## 2023-06-06 MED ORDER — AMLODIPINE BESYLATE 5 MG PO TABS
5.0000 mg | ORAL_TABLET | Freq: Every day | ORAL | 1 refills | Status: DC
  Filled 2023-06-06: qty 90, 90d supply, fill #0
  Filled 2023-09-18: qty 90, 90d supply, fill #1

## 2023-06-06 MED ORDER — HYDROCHLOROTHIAZIDE 12.5 MG PO CAPS
12.5000 mg | ORAL_CAPSULE | Freq: Every day | ORAL | 1 refills | Status: DC
  Filled 2023-06-06: qty 90, 90d supply, fill #0
  Filled 2023-09-18: qty 90, 90d supply, fill #1

## 2023-06-06 MED ORDER — ATORVASTATIN CALCIUM 10 MG PO TABS
10.0000 mg | ORAL_TABLET | Freq: Every day | ORAL | 1 refills | Status: DC
  Filled 2023-06-06 – 2023-07-26 (×2): qty 90, 90d supply, fill #0
  Filled 2023-11-04: qty 90, 90d supply, fill #1

## 2023-06-07 LAB — LIPID PANEL
Chol/HDL Ratio: 3.3 {ratio} (ref 0.0–4.4)
Cholesterol, Total: 160 mg/dL (ref 100–199)
HDL: 48 mg/dL (ref >39)
LDL Chol Calc (NIH): 86 mg/dL (ref 0–99)
Triglycerides: 151 mg/dL — ABNORMAL HIGH (ref 0–149)
VLDL Cholesterol Cal: 26 mg/dL (ref 5–40)

## 2023-06-07 LAB — HEMOGLOBIN A1C
Est. average glucose Bld gHb Est-mCnc: 143 mg/dL
Hgb A1c MFr Bld: 6.6 % — ABNORMAL HIGH (ref 4.8–5.6)

## 2023-06-07 LAB — CMP14+EGFR
ALT: 20 [IU]/L (ref 0–32)
AST: 16 [IU]/L (ref 0–40)
Albumin: 4.3 g/dL (ref 3.9–4.9)
Alkaline Phosphatase: 85 [IU]/L (ref 44–121)
BUN/Creatinine Ratio: 16 (ref 9–23)
BUN: 14 mg/dL (ref 6–24)
Bilirubin Total: 0.3 mg/dL (ref 0.0–1.2)
CO2: 23 mmol/L (ref 20–29)
Calcium: 9.5 mg/dL (ref 8.7–10.2)
Chloride: 102 mmol/L (ref 96–106)
Creatinine, Ser: 0.87 mg/dL (ref 0.57–1.00)
Globulin, Total: 2.9 g/dL (ref 1.5–4.5)
Glucose: 134 mg/dL — ABNORMAL HIGH (ref 70–99)
Potassium: 4 mmol/L (ref 3.5–5.2)
Sodium: 141 mmol/L (ref 134–144)
Total Protein: 7.2 g/dL (ref 6.0–8.5)
eGFR: 85 mL/min/{1.73_m2} (ref >59)

## 2023-06-07 LAB — VITAMIN D 25 HYDROXY (VIT D DEFICIENCY, FRACTURES): Vit D, 25-Hydroxy: 28.4 ng/mL — ABNORMAL LOW (ref 30.0–100.0)

## 2023-06-12 ENCOUNTER — Ambulatory Visit: Payer: Commercial Managed Care - PPO | Admitting: Nurse Practitioner

## 2023-06-14 ENCOUNTER — Other Ambulatory Visit (HOSPITAL_COMMUNITY): Payer: Self-pay

## 2023-06-16 DIAGNOSIS — Z2821 Immunization not carried out because of patient refusal: Secondary | ICD-10-CM | POA: Insufficient documentation

## 2023-06-16 DIAGNOSIS — R0789 Other chest pain: Secondary | ICD-10-CM | POA: Insufficient documentation

## 2023-06-16 MED ORDER — OZEMPIC (0.25 OR 0.5 MG/DOSE) 2 MG/3ML ~~LOC~~ SOPN
0.5000 mg | PEN_INJECTOR | SUBCUTANEOUS | 1 refills | Status: DC
  Filled 2023-06-16: qty 3, 30d supply, fill #0
  Filled 2023-07-26 – 2023-11-21 (×2): qty 3, 28d supply, fill #0

## 2023-06-16 MED ORDER — VITAMIN D (ERGOCALCIFEROL) 50000 UNITS PO CAPS
1.0000 | ORAL_CAPSULE | ORAL | 1 refills | Status: DC
  Filled 2023-06-16: qty 12, 84d supply, fill #0

## 2023-06-16 NOTE — Assessment & Plan Note (Signed)
HgbA1c is stable. Continue focusing on healthy diet and regular exercise.

## 2023-06-16 NOTE — Assessment & Plan Note (Signed)
Blood pressure is controlled, continue current medications

## 2023-06-16 NOTE — Assessment & Plan Note (Signed)

## 2023-06-16 NOTE — Assessment & Plan Note (Signed)
She has been having ongoing chest discomfort.  EKG: Incomplete left bundle branch block.  -Decreasing R-wave progression -may be secondary to conduction defect  consider old anterior infarct. HR 74, I will refer to cardiology due to the bundle branch block this is a change

## 2023-06-16 NOTE — Assessment & Plan Note (Signed)

## 2023-06-16 NOTE — Assessment & Plan Note (Signed)
Cholesterol levels are stable

## 2023-06-16 NOTE — Assessment & Plan Note (Signed)
She is encouraged to strive for BMI less than 30 to decrease cardiac risk. Advised to aim for at least 150 minutes of exercise per week. No longer on weight loss medications.

## 2023-06-16 NOTE — Assessment & Plan Note (Signed)
Will check vitamin D level and supplement as needed.    Also encouraged to spend 15 minutes in the sun daily.   

## 2023-06-17 ENCOUNTER — Other Ambulatory Visit: Payer: Self-pay

## 2023-06-17 ENCOUNTER — Other Ambulatory Visit (HOSPITAL_COMMUNITY): Payer: Self-pay

## 2023-06-18 ENCOUNTER — Encounter: Payer: Self-pay | Admitting: Cardiology

## 2023-06-20 NOTE — Progress Notes (Signed)
Cardiology Clinic Note   Date: 06/22/2023 ID: Anachristina, Meckstroth [birth year], MRN [id number]  Primary Cardiologist:  Debbe Odea, MD  Patient Profile    Leslie Mendoza is a 42 y.o. female who presents to the clinic today for evaluation of chest pain.     Past medical history significant for: Palpitations. 14-day ZIO 07/11/2021: HR 48 to 144 bpm, average 83 bpm.  Predominant underlying rhythm was sinus rhythm.  Slight P wave morphology changes noted.  Isolated ectopy.  No significant arrhythmias. Hypertension. Hyperlipidemia. Lipid panel 06/06/2023: LDL 86, HDL 48, TG 151, total 160. Tobacco abuse. T2DM.     History of Present Illness    Leslie Mendoza was first evaluated by Dr. Azucena Cecil on 06/16/2021 for palpitations and fatigue at the request of [name], FNP.  Patient reported a year+ history of palpitations with associated fatigue and dizziness lasting up to an hour.  14-day ZIO showed HR 48 to 144 bpm, average 83 bpm with no significant arrhythmias.  Patient was last seen in the office by Dr. Azucena Cecil on 03/27/2022 at the request of PCP for abnormal EKG.  EKG showed septal T wave inversion felt to be nonspecific and nonacute.  Patient was asymptomatic.  No further testing indicated at that time.  Patient was seen by PCP on 06/06/2023 for follow-up after COVID.  Patient reported positive COVID the first week of September with symptoms lasting approximately 1 week.  At the time of her visit she complained of a pulling feeling on the left side of her chest when reaching for objects that had been going on for 2 months.  EKG was performed and showed incomplete LBBB and she was referred to cardiology.   Discussed the use of AI scribe software for clinical note transcription with the patient, who gave verbal consent to proceed.  The patient presents with intermittent left-sided chest pain described as feeling like a pulled muscle. The pain is triggered by reaching  for objects or sudden movements, such as laughing. The patient reports that the pain is fleeting and tends to subside quickly. She has tried meloxicam and feels it helps a little. She was concerned since it seems to be lasting so long. 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 Pain does not occur with exertion such as walking. The patient also reports occasional shortness of breath, particularly with exertion or moving around. She reports occasional lower extremity edema particularly after working on her feet all day. She admits to dietary indiscretion with sodium. She denies orthopnea or PND.  Dyspnea seems to be worse than it has been in the past. No recent issues with palpitations. She reports a family history of heart disease, with her father having had heart failure. She continues to smoke 1/2 pack of cigarettes a day. She is interested in quitting.  She has been diagnosed with type 2 diabetes and is due to start taking Ozempic.         ROS: All other systems reviewed and are otherwise negative except as noted in History of Present Illness.  Studies Reviewed    EKG Interpretation Date/Time:  Friday June 21 2023 08:30:48 EDT Ventricular Rate:  76 PR Interval:  138 QRS Duration:  86 QT Interval:  398 QTC Calculation: 447 R Axis:   8  Text Interpretation: Normal sinus rhythm Normal ECG When compared with ECG of 06/06/2023 no significant changes  Confirmed by Carlos Levering [id number]) on 06/21/2023 8:44:13 AM    Risk Assessment/Calculations      HYPERTENSION CONTROL Vitals:   06/21/23 0834 06/21/23 0835 06/21/23 0841  BP: (!) 152/110 (!) 150/104 (!) 140/98    The patient's blood pressure is elevated above target today.  In order to address the patient's elevated BP: Patient has not taken medications today.           Physical Exam    VS:  BP (!) 140/98 (BP Location: Left Arm, Patient Position:  Sitting, Cuff Size: Large)   Pulse 76   Ht 5\' 11"  (1.803 m)   Wt (!) 382 lb 3.2 oz (173.4 kg)   SpO2 97%   BMI 53.31 kg/m  , BMI Body mass index is 53.31 kg/m.  GEN: Well nourished, well developed, in no acute distress. Neck: No JVD or carotid bruits. Cardiac:  RRR. No murmurs. No rubs or gallops.   Respiratory:  Respirations regular and unlabored. Clear to auscultation without rales, wheezing or rhonchi. GI: Soft, nontender, nondistended. Extremities: Radials/DP/PT 2+ and equal bilaterally. No clubbing or cyanosis. No edema bilateral lower extremities but difficult to evaluate secondary to body habitus..   Skin: Warm and dry, no rash. Neuro: Strength intact.  Assessment & Plan      Musculoskeletal Chest Pain Left-sided, intermittent, non-exertional, occurring with reaching or movement. No associated shortness of breath or palpitations. No change in EKG. Mild tenderness to palpation.  -Ischemic evaluation not indicated at this time.  -Advised patient to monitor symptoms and report any changes.  DOE/lower extremity edema Occurs with exertion, possibly due to deconditioning. Patient has a history of smoking. Patient reports this is the heaviest she has ever been. She does not do routine exercise. She also reports occasional lower extremity edema particularly when she works all day. She admits dietary indiscretion.  No lower extremity edema today although hard to ascertain secondary to body habitus.  -Echo for further evaluation of DOE.  -Recommend compression socks.  -Discussed sodium restriction.   Palpitations 14-day ZIO November 2022 showed HR 48 to 144 bpm, average 83 bpm with no significant arrhythmias.  Patient denies recent palpitations. RRR on exam today.  -No further workup indicated.   Hypertension BP today 150/104 on intake and 140/98 on my recheck. She has not taken her medication today. BP always normalizes when she takes her medicine.  -No changes today.  -Continue  amlodipine, HCTZ.  Hyperlipidemia LDL October 2024 86. -Continue atorvastatin. -Followed by PCP.  Tobacco Abuse Patient smokes half a pack per day and expresses a lack of motivation to quit. -Encourage patient to set small, achievable goals for reducing cigarette use.      Disposition: Echo. Return in 3 months or sooner as needed.          Signed, Etta Grandchild. Lou Loewe, DNP, NP-C

## 2023-06-21 ENCOUNTER — Ambulatory Visit: Payer: Commercial Managed Care - PPO | Attending: Student | Admitting: Student

## 2023-06-21 ENCOUNTER — Encounter: Payer: Self-pay | Admitting: Student

## 2023-06-21 VITALS — BP 150/104 | HR 76 | Ht 71.0 in | Wt 382.2 lb

## 2023-06-21 DIAGNOSIS — R0789 Other chest pain: Secondary | ICD-10-CM

## 2023-06-21 DIAGNOSIS — R600 Localized edema: Secondary | ICD-10-CM

## 2023-06-21 DIAGNOSIS — E782 Mixed hyperlipidemia: Secondary | ICD-10-CM | POA: Diagnosis not present

## 2023-06-21 DIAGNOSIS — Z720 Tobacco use: Secondary | ICD-10-CM | POA: Diagnosis not present

## 2023-06-21 DIAGNOSIS — I10 Essential (primary) hypertension: Secondary | ICD-10-CM | POA: Diagnosis not present

## 2023-06-21 DIAGNOSIS — R002 Palpitations: Secondary | ICD-10-CM

## 2023-06-21 DIAGNOSIS — R9431 Abnormal electrocardiogram [ECG] [EKG]: Secondary | ICD-10-CM

## 2023-06-21 DIAGNOSIS — R0609 Other forms of dyspnea: Secondary | ICD-10-CM | POA: Diagnosis not present

## 2023-06-21 NOTE — Patient Instructions (Signed)
Medication Instructions:  No changes *If you need a refill on your cardiac medications before your next appointment, please call your pharmacy*   Lab Work: None ordered If you have labs (blood work) drawn today and your tests are completely normal, you will receive your results only by: MyChart Message (if you have MyChart) OR A paper copy in the mail If you have any lab test that is abnormal or we need to change your treatment, we will call you to review the results.   Testing/Procedures: Your physician has requested that you have an echocardiogram. Echocardiography is a painless test that uses sound waves to create images of your heart. It provides your doctor with information about the size and shape of your heart and how well your heart's chambers and valves are working.   You may receive an ultrasound enhancing agent through an IV if needed to better visualize your heart during the echo. This procedure takes approximately one hour.  There are no restrictions for this procedure.  This will take place at [address] [address], [place] [address]    Follow-Up: At [facility], you and your health needs are our priority.  As part of our continuing mission to provide you with exceptional heart care, we have created designated Provider Care Teams.  These Care Teams include your primary Cardiologist (physician) and Advanced Practice Providers (APPs -  Physician Assistants and Nurse Practitioners) who all work together to provide you with the care you need, when you need it.  We recommend signing up for the patient portal called "MyChart".  Sign up information is provided on this After Visit Summary.  MyChart is used to connect with patients for Virtual Visits (Telemedicine).  Patients are able to view lab/test results, encounter notes, upcoming appointments, etc.  Non-urgent messages can be sent to your provider as well.   To learn more about what you can  do with MyChart, go to [email or web address].    Your next appointment:   3 month(s)  Provider:   You may see Debbe Odea, MD or one of the following Advanced Practice Providers on your designated Care Team:   Nicolasa Ducking, NP Eula Listen, PA-C Cadence Fransico Michael, PA-C Charlsie Quest, NP

## 2023-06-22 ENCOUNTER — Encounter: Payer: Self-pay | Admitting: Student

## 2023-07-12 ENCOUNTER — Other Ambulatory Visit (HOSPITAL_COMMUNITY): Payer: Self-pay

## 2023-07-12 ENCOUNTER — Other Ambulatory Visit: Payer: Self-pay

## 2023-07-15 ENCOUNTER — Ambulatory Visit: Payer: Commercial Managed Care - PPO | Attending: Student

## 2023-07-15 DIAGNOSIS — R0609 Other forms of dyspnea: Secondary | ICD-10-CM

## 2023-07-15 LAB — ECHOCARDIOGRAM COMPLETE
Area-P 1/2: 4.8 cm2
S' Lateral: 3.6 cm

## 2023-07-27 ENCOUNTER — Other Ambulatory Visit (HOSPITAL_COMMUNITY): Payer: Self-pay

## 2023-07-29 ENCOUNTER — Encounter: Payer: Self-pay | Admitting: Nurse Practitioner

## 2023-08-13 ENCOUNTER — Telehealth: Payer: Self-pay

## 2023-08-13 NOTE — Telephone Encounter (Signed)
Ozemo=pic 0.25-0.5mg  was approved 08/02/2023-08/07/2024.  Ref number [id number] Plan code [id number]

## 2023-09-20 ENCOUNTER — Ambulatory Visit: Payer: Commercial Managed Care - PPO | Admitting: Cardiology

## 2023-11-21 ENCOUNTER — Other Ambulatory Visit (HOSPITAL_COMMUNITY): Payer: Self-pay

## 2023-11-21 ENCOUNTER — Other Ambulatory Visit: Payer: Self-pay

## 2023-11-21 ENCOUNTER — Encounter: Payer: Self-pay | Admitting: Pharmacist

## 2023-11-26 ENCOUNTER — Other Ambulatory Visit: Payer: Self-pay

## 2024-01-27 ENCOUNTER — Other Ambulatory Visit: Payer: Self-pay | Admitting: Nurse Practitioner

## 2024-01-27 DIAGNOSIS — I10 Essential (primary) hypertension: Secondary | ICD-10-CM

## 2024-01-27 DIAGNOSIS — R7303 Prediabetes: Secondary | ICD-10-CM

## 2024-01-27 DIAGNOSIS — E782 Mixed hyperlipidemia: Secondary | ICD-10-CM

## 2024-02-11 ENCOUNTER — Other Ambulatory Visit: Payer: Self-pay | Admitting: Nurse Practitioner

## 2024-02-11 DIAGNOSIS — I10 Essential (primary) hypertension: Secondary | ICD-10-CM

## 2024-02-11 DIAGNOSIS — E782 Mixed hyperlipidemia: Secondary | ICD-10-CM

## 2024-02-11 DIAGNOSIS — R7303 Prediabetes: Secondary | ICD-10-CM

## 2024-02-26 ENCOUNTER — Other Ambulatory Visit (HOSPITAL_COMMUNITY): Payer: Self-pay

## 2024-03-08 ENCOUNTER — Encounter: Payer: Self-pay | Admitting: Obstetrics & Gynecology

## 2024-03-16 ENCOUNTER — Other Ambulatory Visit: Payer: Self-pay

## 2024-03-16 ENCOUNTER — Encounter: Payer: Self-pay | Admitting: Nurse Practitioner

## 2024-03-16 ENCOUNTER — Other Ambulatory Visit (HOSPITAL_COMMUNITY): Payer: Self-pay

## 2024-03-16 DIAGNOSIS — I10 Essential (primary) hypertension: Secondary | ICD-10-CM

## 2024-03-16 DIAGNOSIS — R7303 Prediabetes: Secondary | ICD-10-CM

## 2024-03-16 DIAGNOSIS — E782 Mixed hyperlipidemia: Secondary | ICD-10-CM

## 2024-03-16 MED ORDER — AMLODIPINE BESYLATE 5 MG PO TABS
5.0000 mg | ORAL_TABLET | Freq: Every day | ORAL | 0 refills | Status: DC
  Filled 2024-03-16: qty 30, 30d supply, fill #0

## 2024-03-16 MED ORDER — ATORVASTATIN CALCIUM 10 MG PO TABS
10.0000 mg | ORAL_TABLET | Freq: Every day | ORAL | 0 refills | Status: DC
  Filled 2024-03-16: qty 30, 30d supply, fill #0

## 2024-03-16 MED ORDER — HYDROCHLOROTHIAZIDE 12.5 MG PO CAPS
12.5000 mg | ORAL_CAPSULE | Freq: Every day | ORAL | 0 refills | Status: DC
  Filled 2024-03-16: qty 30, 30d supply, fill #0

## 2024-03-17 ENCOUNTER — Ambulatory Visit

## 2024-03-17 ENCOUNTER — Other Ambulatory Visit (HOSPITAL_COMMUNITY): Payer: Self-pay

## 2024-03-30 ENCOUNTER — Ambulatory Visit: Admitting: Nurse Practitioner

## 2024-03-30 ENCOUNTER — Other Ambulatory Visit (HOSPITAL_COMMUNITY): Payer: Self-pay

## 2024-03-30 ENCOUNTER — Encounter: Payer: Self-pay | Admitting: Nurse Practitioner

## 2024-03-30 VITALS — BP 120/70 | HR 84 | Temp 99.1°F | Ht 71.0 in | Wt 377.6 lb

## 2024-03-30 DIAGNOSIS — Z720 Tobacco use: Secondary | ICD-10-CM

## 2024-03-30 DIAGNOSIS — E66813 Obesity, class 3: Secondary | ICD-10-CM

## 2024-03-30 DIAGNOSIS — E559 Vitamin D deficiency, unspecified: Secondary | ICD-10-CM

## 2024-03-30 DIAGNOSIS — I10 Essential (primary) hypertension: Secondary | ICD-10-CM

## 2024-03-30 DIAGNOSIS — Z6841 Body Mass Index (BMI) 40.0 and over, adult: Secondary | ICD-10-CM

## 2024-03-30 DIAGNOSIS — E782 Mixed hyperlipidemia: Secondary | ICD-10-CM

## 2024-03-30 DIAGNOSIS — Z139 Encounter for screening, unspecified: Secondary | ICD-10-CM | POA: Diagnosis not present

## 2024-03-30 DIAGNOSIS — R7303 Prediabetes: Secondary | ICD-10-CM

## 2024-03-30 MED ORDER — ATORVASTATIN CALCIUM 10 MG PO TABS
10.0000 mg | ORAL_TABLET | Freq: Every day | ORAL | 0 refills | Status: DC
  Filled 2024-03-30 – 2024-04-16 (×2): qty 90, 90d supply, fill #0

## 2024-03-30 MED ORDER — HYDROCHLOROTHIAZIDE 12.5 MG PO CAPS
12.5000 mg | ORAL_CAPSULE | Freq: Every day | ORAL | 0 refills | Status: DC
  Filled 2024-03-30 – 2024-04-16 (×2): qty 90, 90d supply, fill #0

## 2024-03-30 MED ORDER — BUPROPION HCL ER (XL) 150 MG PO TB24
150.0000 mg | ORAL_TABLET | ORAL | 2 refills | Status: DC
  Filled 2024-03-30: qty 30, 30d supply, fill #0
  Filled 2024-05-07: qty 30, 30d supply, fill #1

## 2024-03-30 MED ORDER — AMLODIPINE BESYLATE 5 MG PO TABS
5.0000 mg | ORAL_TABLET | Freq: Every day | ORAL | 0 refills | Status: DC
  Filled 2024-03-30 – 2024-04-16 (×2): qty 90, 90d supply, fill #0

## 2024-03-30 NOTE — Progress Notes (Signed)
 [name], CMA,acting as a Neurosurgeon for [name], FNP.,have documented all relevant documentation on the behalf of [name], FNP,as directed by  [name], FNP while in the presence of [name], FNP.  Subjective:  Patient ID: Leslie Mendoza , female    DOB: [birth year] , 43 y.o.   MRN: [id number]  Chief Complaint  Patient presents with   Hypertension    Patient presents today for a bp and pre dm follow up, Patient reports compliance with medication. Patient denies any chest pain, SOB, or headaches. Patient has no concerns today.     HPI Discussed the use of AI scribe software for clinical note transcription with the patient, who gave verbal consent to proceed.  History of Present Illness Leslie Mendoza is a 43 year old female who presents for smoking cessation and weight management.  She has been smoking for 26 years, currently consuming about half a pack per day. On weekends, her consumption increases to up to ten cigarettes a day, while on other days she smokes around five. She has not previously used any medications for smoking cessation, and her efforts to quit have plateaued due to life circumstances.  She is experiencing challenges with weight management. She was previously prescribed Ozempic  but has not been taking it due to insurance coverage issues. There was a period of one to two weeks where she ran out of her medications and was not taking them.  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 She does not attend church or belong to any clubs.  No tension, restlessness, nervousness, anxiety, or sleep disturbances due to a troubled mind.   She is working now as a Manufacturing systems engineer in the lab. She is not walking as much.      Past Medical History:  Diagnosis Date   Achilles tendinitis    Arthritis 2021   Feet   Hyperlipidemia 2019    Hypertension      Family History  Problem Relation Age of Onset   Cancer Mother    Hypertension Mother    Breast cancer Mother    Obesity Mother    Heart disease Father    Diabetes Father    Obesity Father    Cancer Maternal Grandmother    Diabetes Maternal Grandmother    Diabetes Maternal Aunt      Current Outpatient Medications:    albuterol  (VENTOLIN  HFA) 108 (90 Base) MCG/ACT inhaler, Inhale 1-2 puffs into the lungs every 6 (six) hours as needed for wheezing or shortness of breath., Disp: 18 g, Rfl: 0   buPROPion  (WELLBUTRIN  XL) 150 MG 24 hr tablet, Take 1 tablet (150 mg total) by mouth every morning., Disp: 30 tablet, Rfl: 2   meloxicam  (MOBIC ) 15 MG tablet, Take 15 mg by mouth daily as needed for pain., Disp: , Rfl:    Vitamin D , Ergocalciferol , 50000 units CAPS, Take 1 capsule by mouth once a week., Disp: 12 capsule, Rfl: 1   amLODipine  (NORVASC ) 5 MG tablet, Take 1 tablet (5 mg total) by mouth daily., Disp: 90 tablet, Rfl: 0   atorvastatin  (LIPITOR) 10 MG tablet, Take 1 tablet (10 mg total) by mouth daily., Disp: 90 tablet, Rfl: 0   hydrochlorothiazide  (MICROZIDE ) 12.5 MG capsule, Take 1 capsule by mouth daily., Disp: 90 capsule, Rfl: 0   Semaglutide ,0.25 or 0.5MG /DOS, (OZEMPIC , 0.25 OR  0.5 MG/DOSE,) 2 MG/3ML SOPN, Inject 0.5 mg into the skin once a week. (Patient not taking: Reported on 03/30/2024), Disp: 9 mL, Rfl: 1   Allergies  Allergen Reactions   Aspirin     rash   Pineapple     hives     Review of Systems  Constitutional: Negative.   HENT: Negative.    Eyes: Negative.   Respiratory: Negative.    Cardiovascular: Negative.   Gastrointestinal: Negative.   Neurological: Negative.   Psychiatric/Behavioral: Negative.       Today's Vitals   03/30/24 1153  BP: 120/70  Pulse: 84  Temp: 99.1 F (37.3 C)  TempSrc: Oral  Weight: (!) 377 lb 9.6 oz (171.3 kg)  Height: 5' 11 (1.803 m)  PainSc: 4   PainLoc: Ankle   Body mass index is 52.66 kg/m.  Wt Readings  from Last 3 Encounters:  03/30/24 (!) 377 lb 9.6 oz (171.3 kg)  06/21/23 (!) 382 lb 3.2 oz (173.4 kg)  06/06/23 (!) 384 lb 3.2 oz (174.3 kg)     Objective:  Physical Exam Vitals and nursing note reviewed.  Constitutional:      General: She is not in acute distress.    Appearance: Normal appearance. She is obese.  Cardiovascular:     Rate and Rhythm: Normal rate and regular rhythm.     Pulses: Normal pulses.     Heart sounds: Normal heart sounds. No murmur heard. Pulmonary:     Effort: Pulmonary effort is normal. No respiratory distress.     Breath sounds: Normal breath sounds. No wheezing.  Skin:    General: Skin is warm and dry.     Capillary Refill: Capillary refill takes less than 2 seconds.  Neurological:     General: No focal deficit present.     Mental Status: She is alert and oriented to person, place, and time.     Cranial Nerves: No cranial nerve deficit.     Motor: No weakness.  Psychiatric:        Mood and Affect: Mood normal.        Behavior: Behavior normal.        Thought Content: Thought content normal.        Judgment: Judgment normal.        Assessment And Plan:  Essential hypertension Assessment & Plan: Blood pressure is controlled, continue current medications.   Orders: -     BMP8+eGFR -     amLODIPine  Besylate; Take 1 tablet (5 mg total) by mouth daily.  Dispense: 90 tablet; Refill: 0 -     hydroCHLOROthiazide ; Take 1 capsule by mouth daily.  Dispense: 90 capsule; Refill: 0  Prediabetes Assessment & Plan: HgbA1c is stable. Continue focusing on healthy diet and regular exercise.   Orders: -     BMP8+eGFR -     Hemoglobin A1c -     Atorvastatin  Calcium ; Take 1 tablet (10 mg total) by mouth daily.  Dispense: 90 tablet; Refill: 0  Mixed hyperlipidemia Assessment & Plan: Cholesterol levels are stable.   Orders: -     Lipid panel -     Atorvastatin  Calcium ; Take 1 tablet (10 mg total) by mouth daily.  Dispense: 90 tablet; Refill: 0  Class 3  severe obesity due to excess calories with body mass index (BMI) of 50.0 to 59.9 in adult Assessment & Plan: Class 3 severe obesity poses a significant health risk. Bupropion  may assist in weight loss by reducing cravings for sweets and unhealthy  foods. - Prescribe bupropion  150 mg oral daily in the morning, which may aid in weight loss. - her insurance no longer covers GLP1s for weight loss.   Orders: -     buPROPion  HCl ER (XL); Take 1 tablet (150 mg total) by mouth every morning.  Dispense: 30 tablet; Refill: 2  Vitamin D  deficiency Assessment & Plan: Will check vitamin D  level and supplement as needed.    Also encouraged to spend 15 minutes in the sun daily.    Orders: -     VITAMIN D  25 Hydroxy (Vit-D Deficiency, Fractures)  Tobacco abuse Assessment & Plan: She has a 26-year history of smoking, currently half a pack per day, with increased use on weekends. Previous cessation attempts failed. Bupropion  was discussed as a cessation aid and for weight loss, with risks including rare seizures and mood changes explained. - Prescribe bupropion  150 mg oral daily in the morning for smoking cessation. - Follow up in 6 weeks to assess response to medication and smoking cessation progress.  Orders: -     buPROPion  HCl ER (XL); Take 1 tablet (150 mg total) by mouth every morning.  Dispense: 30 tablet; Refill: 2  Encounter for screening -     Hepatitis B surface antibody,qualitative   Return for 6 month bp check; 6 week f/u smoking cessation.  Patient was given opportunity to ask questions. Patient verbalized understanding of the plan and was able to repeat key elements of the plan. All questions were answered to their satisfaction.   LILLETTE [name], FNP, have reviewed all documentation for this visit. The documentation on 03/30/24 for the exam, diagnosis, procedures, and orders are all accurate and complete.    IF YOU HAVE BEEN REFERRED TO A SPECIALIST, IT MAY TAKE 1-2 WEEKS TO  SCHEDULE/PROCESS THE REFERRAL. IF YOU HAVE NOT HEARD FROM US /SPECIALIST IN TWO WEEKS, PLEASE GIVE US  A CALL AT [phone] X [phone].

## 2024-03-31 LAB — BMP8+EGFR
BUN/Creatinine Ratio: 16 (ref 9–23)
BUN: 15 mg/dL (ref 6–24)
CO2: 26 mmol/L (ref 20–29)
Calcium: 9.4 mg/dL (ref 8.7–10.2)
Chloride: 101 mmol/L (ref 96–106)
Creatinine, Ser: 0.96 mg/dL (ref 0.57–1.00)
Glucose: 129 mg/dL — ABNORMAL HIGH (ref 70–99)
Potassium: 4.1 mmol/L (ref 3.5–5.2)
Sodium: 142 mmol/L (ref 134–144)
eGFR: 75 mL/min/1.73 (ref >59)

## 2024-03-31 LAB — LIPID PANEL
Chol/HDL Ratio: 3.4 ratio (ref 0.0–4.4)
Cholesterol, Total: 172 mg/dL (ref 100–199)
HDL: 50 mg/dL (ref >39)
LDL Chol Calc (NIH): 84 mg/dL (ref 0–99)
Triglycerides: 232 mg/dL — ABNORMAL HIGH (ref 0–149)
VLDL Cholesterol Cal: 38 mg/dL (ref 5–40)

## 2024-03-31 LAB — HEPATITIS B SURFACE ANTIBODY,QUALITATIVE: Hep B Surface Ab, Qual: REACTIVE

## 2024-03-31 LAB — HEMOGLOBIN A1C
Est. average glucose Bld gHb Est-mCnc: 140 mg/dL
Hgb A1c MFr Bld: 6.5 % — ABNORMAL HIGH (ref 4.8–5.6)

## 2024-03-31 LAB — VITAMIN D 25 HYDROXY (VIT D DEFICIENCY, FRACTURES): Vit D, 25-Hydroxy: 15 ng/mL — ABNORMAL LOW (ref 30.0–100.0)

## 2024-04-02 ENCOUNTER — Ambulatory Visit: Admitting: Nurse Practitioner

## 2024-04-13 ENCOUNTER — Ambulatory Visit: Payer: Self-pay | Admitting: Nurse Practitioner

## 2024-04-13 DIAGNOSIS — Z720 Tobacco use: Secondary | ICD-10-CM | POA: Insufficient documentation

## 2024-04-13 DIAGNOSIS — E559 Vitamin D deficiency, unspecified: Secondary | ICD-10-CM

## 2024-04-13 MED ORDER — VITAMIN D (ERGOCALCIFEROL) 50000 UNITS PO CAPS
1.0000 | ORAL_CAPSULE | ORAL | 1 refills | Status: DC
  Filled 2024-04-13: qty 12, 84d supply, fill #0

## 2024-04-13 NOTE — Assessment & Plan Note (Signed)
 Will check vitamin D  level and supplement as needed.    Also encouraged to spend 15 minutes in the sun daily.

## 2024-04-13 NOTE — Assessment & Plan Note (Signed)
HgbA1c is stable. Continue focusing on healthy diet and regular exercise.

## 2024-04-13 NOTE — Assessment & Plan Note (Signed)
 Cholesterol levels are stable

## 2024-04-13 NOTE — Assessment & Plan Note (Signed)
 Blood pressure is controlled, continue current medications

## 2024-04-13 NOTE — Assessment & Plan Note (Signed)
 Class 3 severe obesity poses a significant health risk. Bupropion  may assist in weight loss by reducing cravings for sweets and unhealthy foods. - Prescribe bupropion  150 mg oral daily in the morning, which may aid in weight loss. - her insurance no longer covers GLP1s for weight loss.

## 2024-04-13 NOTE — Assessment & Plan Note (Signed)
 She has a 26-year history of smoking, currently half a pack per day, with increased use on weekends. Previous cessation attempts failed. Bupropion  was discussed as a cessation aid and for weight loss, with risks including rare seizures and mood changes explained. - Prescribe bupropion  150 mg oral daily in the morning for smoking cessation. - Follow up in 6 weeks to assess response to medication and smoking cessation progress.

## 2024-04-14 ENCOUNTER — Other Ambulatory Visit (HOSPITAL_COMMUNITY): Payer: Self-pay

## 2024-04-16 ENCOUNTER — Other Ambulatory Visit: Payer: Self-pay | Admitting: Nurse Practitioner

## 2024-04-16 ENCOUNTER — Other Ambulatory Visit (HOSPITAL_COMMUNITY): Payer: Self-pay

## 2024-04-16 DIAGNOSIS — E1169 Type 2 diabetes mellitus with other specified complication: Secondary | ICD-10-CM

## 2024-04-16 MED ORDER — MOUNJARO 5 MG/0.5ML ~~LOC~~ SOAJ
5.0000 mg | SUBCUTANEOUS | 1 refills | Status: DC
  Filled 2024-04-16: qty 2, 28d supply, fill #0
  Filled 2024-05-10: qty 2, 28d supply, fill #1

## 2024-05-13 ENCOUNTER — Encounter: Payer: Self-pay | Admitting: Nurse Practitioner

## 2024-05-13 ENCOUNTER — Other Ambulatory Visit (HOSPITAL_COMMUNITY): Payer: Self-pay

## 2024-05-13 ENCOUNTER — Ambulatory Visit: Admitting: Nurse Practitioner

## 2024-05-13 VITALS — BP 128/80 | HR 75 | Temp 98.9°F | Ht 71.0 in | Wt 367.2 lb

## 2024-05-13 DIAGNOSIS — Z6841 Body Mass Index (BMI) 40.0 and over, adult: Secondary | ICD-10-CM

## 2024-05-13 DIAGNOSIS — Z720 Tobacco use: Secondary | ICD-10-CM

## 2024-05-13 DIAGNOSIS — E1169 Type 2 diabetes mellitus with other specified complication: Secondary | ICD-10-CM | POA: Diagnosis not present

## 2024-05-13 DIAGNOSIS — E66813 Obesity, class 3: Secondary | ICD-10-CM | POA: Diagnosis not present

## 2024-05-13 DIAGNOSIS — E669 Obesity, unspecified: Secondary | ICD-10-CM | POA: Diagnosis not present

## 2024-05-13 MED ORDER — BUPROPION HCL ER (XL) 150 MG PO TB24
150.0000 mg | ORAL_TABLET | ORAL | 1 refills | Status: DC
  Filled 2024-05-13 – 2024-06-02 (×2): qty 90, 90d supply, fill #0

## 2024-05-13 MED ORDER — MOUNJARO 7.5 MG/0.5ML ~~LOC~~ SOAJ
7.5000 mg | SUBCUTANEOUS | 1 refills | Status: AC
  Filled 2024-05-13: qty 6, 84d supply, fill #0
  Filled 2024-06-02: qty 2, 28d supply, fill #0
  Filled 2024-06-28: qty 2, 28d supply, fill #1
  Filled 2024-07-27: qty 2, 28d supply, fill #2
  Filled 2024-08-23: qty 2, 28d supply, fill #3
  Filled 2024-09-29: qty 2, 28d supply, fill #4

## 2024-05-13 NOTE — Assessment & Plan Note (Signed)
 She has recently started on Mounjaro  now on 5 mg weekly. Tolerating well other than when she overeats or eats the wrong foods

## 2024-05-13 NOTE — Progress Notes (Signed)
 [name], CMA,acting as a Neurosurgeon for [name], FNP.,have documented all relevant documentation on the behalf of [name], FNP,as directed by  [name], FNP while in the presence of [name], FNP.  Subjective:  Patient ID: Leslie Mendoza , female    DOB: 1981-05-07 , 43 y.o.   MRN: [id number]  Chief Complaint  Patient presents with   Nicotine Dependence    Patient presents today for a nicotine dependence follow up, Patient reports compliance with medication. Patient denies any chest pain, SOB, or headaches. Patient reports the medication is helping but she is still smoking. She reports she went from 10-15 a day to 6 a day.     HPI  HPI   Past Medical History:  Diagnosis Date   Achilles tendinitis    Arthritis 2021   Feet   Heart palpitations 10/06/2018   Hyperlipidemia 2019   Hypertension      Family History  Problem Relation Age of Onset   Cancer Mother    Hypertension Mother    Breast cancer Mother    Obesity Mother    Heart disease Father    Diabetes Father    Obesity Father    Cancer Maternal Grandmother    Diabetes Maternal Grandmother    Diabetes Maternal Aunt      Current Outpatient Medications:    albuterol  (VENTOLIN  HFA) 108 (90 Base) MCG/ACT inhaler, Inhale 1-2 puffs into the lungs every 6 (six) hours as needed for wheezing or shortness of breath., Disp: 18 g, Rfl: 0   amLODipine  (NORVASC ) 5 MG tablet, Take 1 tablet (5 mg total) by mouth daily., Disp: 90 tablet, Rfl: 0   atorvastatin  (LIPITOR) 10 MG tablet, Take 1 tablet (10 mg total) by mouth daily., Disp: 90 tablet, Rfl: 0   hydrochlorothiazide  (MICROZIDE ) 12.5 MG capsule, Take 1 capsule by mouth daily., Disp: 90 capsule, Rfl: 0   meloxicam  (MOBIC ) 15 MG tablet, Take 15 mg by mouth daily as needed for pain., Disp: , Rfl:    tirzepatide  (MOUNJARO ) 7.5 MG/0.5ML Pen, Inject 7.5 mg into the skin once a week., Disp: 6 mL, Rfl: 1   Vitamin D , Ergocalciferol , 50000 units CAPS, Take 1 capsule  by mouth 2 (two) times a week., Disp: 24 capsule, Rfl: 1   buPROPion  (WELLBUTRIN  XL) 150 MG 24 hr tablet, Take 1 tablet (150 mg total) by mouth every morning., Disp: 90 tablet, Rfl: 1   Allergies  Allergen Reactions   Aspirin     rash   Pineapple     hives     Review of Systems  Constitutional: Negative.   Respiratory: Negative.    Cardiovascular: Negative.   Neurological: Negative.   Psychiatric/Behavioral: Negative.       Today's Vitals   05/13/24 1154  BP: 128/80  Pulse: 75  Temp: 98.9 F (37.2 C)  TempSrc: Oral  Weight: (!) 367 lb 3.2 oz (166.6 kg)  Height: 5' 11 (1.803 m)  PainSc: 0-No pain   Body mass index is 51.21 kg/m.  Wt Readings from Last 3 Encounters:  05/13/24 (!) 367 lb 3.2 oz (166.6 kg)  03/30/24 (!) 377 lb 9.6 oz (171.3 kg)  06/21/23 (!) 382 lb 3.2 oz (173.4 kg)      Objective:  Physical Exam Vitals and nursing note reviewed.  Constitutional:      General: She is not in acute distress.    Appearance: Normal appearance. She is obese.  Cardiovascular:     Rate and Rhythm: Normal  rate and regular rhythm.     Pulses: Normal pulses.     Heart sounds: Normal heart sounds. No murmur heard. Pulmonary:     Effort: Pulmonary effort is normal. No respiratory distress.     Breath sounds: Normal breath sounds. No wheezing.  Skin:    General: Skin is warm and dry.     Capillary Refill: Capillary refill takes less than 2 seconds.  Neurological:     General: No focal deficit present.     Mental Status: She is alert and oriented to person, place, and time.     Cranial Nerves: No cranial nerve deficit.     Motor: No weakness.  Psychiatric:        Mood and Affect: Mood normal.        Behavior: Behavior normal.        Thought Content: Thought content normal.        Judgment: Judgment normal.      Assessment And Plan:  Tobacco abuse Assessment & Plan: She is doing well with her smoking cessation, down to 6 cigarettes a day from 15 per day. Continue  wellbutrin .   Orders: -     buPROPion  HCl ER (XL); Take 1 tablet (150 mg total) by mouth every morning.  Dispense: 90 tablet; Refill: 1  Type 2 diabetes mellitus with obesity (HCC) Assessment & Plan: She has recently started on Mounjaro  now on 5 mg weekly. Tolerating well other than when she overeats or eats the wrong foods  Orders: -     Mounjaro ; Inject 7.5 mg into the skin once a week.  Dispense: 6 mL; Refill: 1  Class 3 severe obesity due to excess calories with body mass index (BMI) of 50.0 to 59.9 in adult Assessment & Plan: Weight is down 10 lbs since her last visit. Encouraged to exercise regularly  Orders: -     buPROPion  HCl ER (XL); Take 1 tablet (150 mg total) by mouth every morning.  Dispense: 90 tablet; Refill: 1    Return in about 2 months (around 07/13/2024) for diabetes and smoking f/u.  Patient was given opportunity to ask questions. Patient verbalized understanding of the plan and was able to repeat key elements of the plan. All questions were answered to their satisfaction.    LILLETTE [name], FNP, have reviewed all documentation for this visit. The documentation on 05/13/24 for the exam, diagnosis, procedures, and orders are all accurate and complete.   IF YOU HAVE BEEN REFERRED TO A SPECIALIST, IT MAY TAKE 1-2 WEEKS TO SCHEDULE/PROCESS THE REFERRAL. IF YOU HAVE NOT HEARD FROM US /SPECIALIST IN TWO WEEKS, PLEASE GIVE US  A CALL AT [phone] X [phone].

## 2024-05-13 NOTE — Assessment & Plan Note (Signed)
 Weight is down 10 lbs since her last visit. Encouraged to exercise regularly

## 2024-05-13 NOTE — Assessment & Plan Note (Signed)
 She is doing well with her smoking cessation, down to 6 cigarettes a day from 15 per day. Continue wellbutrin .

## 2024-06-02 ENCOUNTER — Other Ambulatory Visit (HOSPITAL_COMMUNITY): Payer: Self-pay

## 2024-06-02 ENCOUNTER — Other Ambulatory Visit: Payer: Self-pay

## 2024-06-03 ENCOUNTER — Other Ambulatory Visit: Payer: Self-pay

## 2024-06-03 ENCOUNTER — Encounter: Payer: Self-pay | Admitting: Pharmacist

## 2024-06-05 ENCOUNTER — Other Ambulatory Visit (HOSPITAL_COMMUNITY): Payer: Self-pay

## 2024-06-15 ENCOUNTER — Other Ambulatory Visit (HOSPITAL_COMMUNITY): Payer: Self-pay

## 2024-06-15 MED ORDER — FLUZONE 0.5 ML IM SUSY
PREFILLED_SYRINGE | INTRAMUSCULAR | 0 refills | Status: DC
  Filled 2024-06-15: qty 0.5, 1d supply, fill #0

## 2024-06-29 ENCOUNTER — Other Ambulatory Visit: Payer: Self-pay

## 2024-07-13 ENCOUNTER — Ambulatory Visit: Payer: Self-pay | Admitting: Nurse Practitioner

## 2024-07-13 ENCOUNTER — Other Ambulatory Visit (HOSPITAL_COMMUNITY): Payer: Self-pay

## 2024-07-13 ENCOUNTER — Encounter: Payer: Self-pay | Admitting: Nurse Practitioner

## 2024-07-13 VITALS — BP 130/70 | HR 83 | Temp 97.5°F | Ht 71.0 in | Wt 346.8 lb

## 2024-07-13 DIAGNOSIS — E559 Vitamin D deficiency, unspecified: Secondary | ICD-10-CM | POA: Diagnosis not present

## 2024-07-13 DIAGNOSIS — E119 Type 2 diabetes mellitus without complications: Secondary | ICD-10-CM | POA: Diagnosis not present

## 2024-07-13 DIAGNOSIS — E6609 Other obesity due to excess calories: Secondary | ICD-10-CM | POA: Diagnosis not present

## 2024-07-13 DIAGNOSIS — E669 Obesity, unspecified: Secondary | ICD-10-CM

## 2024-07-13 DIAGNOSIS — Z6841 Body Mass Index (BMI) 40.0 and over, adult: Secondary | ICD-10-CM

## 2024-07-13 DIAGNOSIS — E782 Mixed hyperlipidemia: Secondary | ICD-10-CM | POA: Diagnosis not present

## 2024-07-13 DIAGNOSIS — E1169 Type 2 diabetes mellitus with other specified complication: Secondary | ICD-10-CM

## 2024-07-13 DIAGNOSIS — Z139 Encounter for screening, unspecified: Secondary | ICD-10-CM | POA: Diagnosis not present

## 2024-07-13 DIAGNOSIS — I10 Essential (primary) hypertension: Secondary | ICD-10-CM | POA: Diagnosis not present

## 2024-07-13 DIAGNOSIS — Z720 Tobacco use: Secondary | ICD-10-CM

## 2024-07-13 DIAGNOSIS — E66813 Obesity, class 3: Secondary | ICD-10-CM

## 2024-07-13 MED ORDER — VITAMIN D (ERGOCALCIFEROL) 50000 UNITS PO CAPS
1.0000 | ORAL_CAPSULE | ORAL | 1 refills | Status: AC
  Filled 2024-07-13: qty 24, 168d supply, fill #0
  Filled 2024-07-27: qty 12, 84d supply, fill #0

## 2024-07-13 MED ORDER — ATORVASTATIN CALCIUM 10 MG PO TABS
10.0000 mg | ORAL_TABLET | Freq: Every day | ORAL | 1 refills | Status: AC
  Filled 2024-07-13 – 2024-07-27 (×2): qty 90, 90d supply, fill #0

## 2024-07-13 MED ORDER — HYDROCHLOROTHIAZIDE 12.5 MG PO CAPS
12.5000 mg | ORAL_CAPSULE | Freq: Every day | ORAL | 1 refills | Status: AC
  Filled 2024-07-13 – 2024-07-27 (×2): qty 90, 90d supply, fill #0

## 2024-07-13 MED ORDER — AMLODIPINE BESYLATE 5 MG PO TABS
5.0000 mg | ORAL_TABLET | Freq: Every day | ORAL | 1 refills | Status: AC
  Filled 2024-07-13 – 2024-07-27 (×2): qty 90, 90d supply, fill #0

## 2024-07-13 MED ORDER — BUPROPION HCL ER (XL) 150 MG PO TB24
150.0000 mg | ORAL_TABLET | ORAL | 1 refills | Status: AC
  Filled 2024-07-13 – 2024-09-03 (×3): qty 90, 90d supply, fill #0

## 2024-07-13 NOTE — Assessment & Plan Note (Signed)
 Diabetes management ongoing with regular foot checks. - Performed annual foot examination, no abnormal findings - Advised daily foot checks for open areas or wounds. - Advised avoiding walking barefoot.

## 2024-07-13 NOTE — Assessment & Plan Note (Signed)
 Nicotine use decreased. Smoking cessation efforts ongoing. - Continue efforts to reduce smoking.

## 2024-07-13 NOTE — Assessment & Plan Note (Signed)
 Weight management ongoing with noted weight loss since last visit. - Continue current weight management plan. - Checked weight.

## 2024-07-13 NOTE — Assessment & Plan Note (Addendum)
 Blood pressure is controlled, continue current medications

## 2024-07-13 NOTE — Progress Notes (Signed)
 [name], CMA,acting as a neurosurgeon for [name], FNP.,have documented all relevant documentation on the behalf of [name], FNP,as directed by  [name], FNP while in the presence of [name], FNP.  Subjective:  Patient ID: Leslie Mendoza , female    DOB: 03/12/1981 , 43 y.o.   MRN: [id number]  Chief Complaint  Patient presents with   Hypertension    Patient presents today for a bp and dm follow up, Patient reports compliance with medication. Patient denies any chest pain, SOB, or headaches. Patient has no concerns today.     HPI  Discussed the use of AI scribe software for clinical note transcription with the patient, who gave verbal consent to proceed.  History of Present Illness Leslie Mendoza is a 43 year old female with diabetes who presents for a follow-up visit.  She experiences occasional nausea without vomiting. She has had constipation, which she attributes to insufficient water intake, but notes improvement recently.  She has noticed a change in her weight, with her clothes fitting differently. Her weight was 367 pounds at her last visit, down from 382 pounds in October of the previous year.  She continues to smoke, although she has reduced her intake to four to six cigarettes a day, down from five to ten cigarettes a day.  She recently increased her dose of Moujaro two weeks ago.  She does not like others touching her feet and does not get pedicures. She sometimes walks barefoot at home but tries to use slippers.  Her medications are running out, and she received a text indicating that none of them have refills. She recalls receiving a 90-day supply previously.  Past Medical History:  Diagnosis Date   Achilles tendinitis    Arthritis 2021   Feet   Heart palpitations 10/06/2018   Hyperlipidemia 2019   Hypertension 2019     Family History  Problem Relation Age of Onset   Cancer Mother    Hypertension Mother    Breast cancer Mother     Obesity Mother    Heart disease Father    Diabetes Father    Obesity Father    Cancer Maternal Grandmother    Diabetes Maternal Grandmother    Diabetes Maternal Aunt      Current Outpatient Medications:    albuterol  (VENTOLIN  HFA) 108 (90 Base) MCG/ACT inhaler, Inhale 1-2 puffs into the lungs every 6 (six) hours as needed for wheezing or shortness of breath., Disp: 18 g, Rfl: 0   meloxicam  (MOBIC ) 15 MG tablet, Take 15 mg by mouth daily as needed for pain., Disp: , Rfl:    tirzepatide  (MOUNJARO ) 7.5 MG/0.5ML Pen, Inject 7.5 mg into the skin once a week., Disp: 6 mL, Rfl: 1   amLODipine  (NORVASC ) 5 MG tablet, Take 1 tablet (5 mg total) by mouth daily., Disp: 90 tablet, Rfl: 1   atorvastatin  (LIPITOR) 10 MG tablet, Take 1 tablet (10 mg total) by mouth daily., Disp: 90 tablet, Rfl: 1   buPROPion  (WELLBUTRIN  XL) 150 MG 24 hr tablet, Take 1 tablet (150 mg total) by mouth every morning., Disp: 90 tablet, Rfl: 1   hydrochlorothiazide  (MICROZIDE ) 12.5 MG capsule, Take 1 capsule by mouth daily., Disp: 90 capsule, Rfl: 1   Vitamin D , Ergocalciferol , 50000 units CAPS, Take 1 capsule by mouth 2 (two) times a week., Disp: 24 capsule, Rfl: 1   Allergies  Allergen Reactions   Aspirin     rash   Pineapple  hives     Review of Systems  Constitutional: Negative.   HENT: Negative.    Eyes: Negative.   Respiratory: Negative.    Cardiovascular: Negative.   Gastrointestinal: Negative.   Neurological: Negative.   Psychiatric/Behavioral: Negative.       Today's Vitals   07/13/24 1200  BP: 130/70  Pulse: 83  Temp: (!) 97.5 F (36.4 C)  TempSrc: Oral  Weight: (!) 346 lb 12.8 oz (157.3 kg)  Height: 5' 11 (1.803 m)  PainSc: 0-No pain   Body mass index is 48.37 kg/m.  Wt Readings from Last 3 Encounters:  07/13/24 (!) 346 lb 12.8 oz (157.3 kg)  05/13/24 (!) 367 lb 3.2 oz (166.6 kg)  03/30/24 (!) 377 lb 9.6 oz (171.3 kg)     Objective:  Physical Exam Vitals and nursing note reviewed.   Constitutional:      General: She is not in acute distress.    Appearance: Normal appearance. She is obese.  Cardiovascular:     Rate and Rhythm: Normal rate and regular rhythm.     Pulses: Normal pulses.     Heart sounds: Normal heart sounds. No murmur heard. Pulmonary:     Effort: Pulmonary effort is normal. No respiratory distress.     Breath sounds: Normal breath sounds. No wheezing.  Skin:    General: Skin is warm and dry.     Capillary Refill: Capillary refill takes less than 2 seconds.  Neurological:     General: No focal deficit present.     Mental Status: She is alert and oriented to person, place, and time.     Cranial Nerves: No cranial nerve deficit.     Motor: No weakness.  Psychiatric:        Mood and Affect: Mood normal.        Behavior: Behavior normal.        Thought Content: Thought content normal.        Judgment: Judgment normal.      Diabetic foot exam was performed with the following findings:   No deformities, ulcerations, or other skin breakdown Normal sensation of 10g monofilament Intact posterior tibialis and dorsalis pedis pulses     Assessment And Plan:   Assessment & Plan Essential hypertension Blood pressure is controlled, continue current medications.  Type 2 diabetes mellitus in patient with obesity (HCC) Diabetes management ongoing with regular foot checks. - Performed annual foot examination, no abnormal findings - Advised daily foot checks for open areas or wounds. - Advised avoiding walking barefoot. Tobacco abuse Nicotine use decreased. Smoking cessation efforts ongoing. - Continue efforts to reduce smoking. Encounter for screening  Mixed hyperlipidemia Cholesterol levels are stable.  Vitamin D  deficiency Will check vitamin D  level and supplement as needed.    Also encouraged to spend 15 minutes in the sun daily.   Class 3 severe obesity due to excess calories with serious comorbidity and body mass index (BMI) of 45.0 to  49.9 in adult [facility]) Weight management ongoing with noted weight loss since last visit. - Continue current weight management plan. - Checked weight.  Orders Placed This Encounter  Procedures   Hemoglobin A1c   BMP8+eGFR   Hepatitis B Surface Antibody   VITAMIN D  25 Hydroxy (Vit-D Deficiency, Fractures)   Return for KEEP SAME NEXT.  Patient was given opportunity to ask questions. Patient verbalized understanding of the plan and was able to repeat key elements of the plan. All questions were answered to their satisfaction.  LILLETTE [name], FNP, have reviewed all documentation for this visit. The documentation on 07/13/24 for the exam, diagnosis, procedures, and orders are all accurate and complete.   IF YOU HAVE BEEN REFERRED TO A SPECIALIST, IT MAY TAKE 1-2 WEEKS TO SCHEDULE/PROCESS THE REFERRAL. IF YOU HAVE NOT HEARD FROM US /SPECIALIST IN TWO WEEKS, PLEASE GIVE US  A CALL AT [phone] X [phone].

## 2024-07-13 NOTE — Assessment & Plan Note (Addendum)
 Cholesterol levels are stable

## 2024-07-13 NOTE — Assessment & Plan Note (Addendum)
 Will check vitamin D  level and supplement as needed.    Also encouraged to spend 15 minutes in the sun daily.

## 2024-07-14 ENCOUNTER — Other Ambulatory Visit: Payer: Self-pay

## 2024-07-14 ENCOUNTER — Ambulatory Visit: Payer: Self-pay | Admitting: Nurse Practitioner

## 2024-07-14 LAB — BMP8+EGFR
BUN/Creatinine Ratio: 15 (ref 9–23)
BUN: 15 mg/dL (ref 6–24)
CO2: 24 mmol/L (ref 20–29)
Calcium: 9.7 mg/dL (ref 8.7–10.2)
Chloride: 100 mmol/L (ref 96–106)
Creatinine, Ser: 1.03 mg/dL — ABNORMAL HIGH (ref 0.57–1.00)
Glucose: 76 mg/dL (ref 70–99)
Potassium: 4.2 mmol/L (ref 3.5–5.2)
Sodium: 140 mmol/L (ref 134–144)
eGFR: 69 mL/min/1.73 (ref >59)

## 2024-07-14 LAB — HEMOGLOBIN A1C
Est. average glucose Bld gHb Est-mCnc: 128 mg/dL
Hgb A1c MFr Bld: 6.1 % — ABNORMAL HIGH (ref 4.8–5.6)

## 2024-07-14 LAB — HEPATITIS B SURFACE ANTIBODY,QUALITATIVE: Hep B Surface Ab, Qual: REACTIVE

## 2024-07-14 LAB — VITAMIN D 25 HYDROXY (VIT D DEFICIENCY, FRACTURES): Vit D, 25-Hydroxy: 33.1 ng/mL (ref 30.0–100.0)

## 2024-07-24 ENCOUNTER — Other Ambulatory Visit (HOSPITAL_COMMUNITY): Payer: Self-pay

## 2024-07-27 ENCOUNTER — Other Ambulatory Visit: Payer: Self-pay

## 2024-07-27 ENCOUNTER — Other Ambulatory Visit (HOSPITAL_COMMUNITY): Payer: Self-pay

## 2024-08-14 ENCOUNTER — Telehealth: Payer: Self-pay

## 2024-08-14 NOTE — Telephone Encounter (Signed)
 PA for ozempic  approved 08/11/2024-08/10/2025.

## 2024-08-31 LAB — OPHTHALMOLOGY REPORT-SCANNED

## 2024-09-01 ENCOUNTER — Other Ambulatory Visit: Payer: Self-pay

## 2024-09-01 DIAGNOSIS — H4710 Unspecified papilledema: Secondary | ICD-10-CM

## 2024-09-03 ENCOUNTER — Other Ambulatory Visit: Payer: Self-pay

## 2024-09-09 ENCOUNTER — Other Ambulatory Visit

## 2024-09-18 ENCOUNTER — Inpatient Hospital Stay: Admission: RE | Admit: 2024-09-18 | Source: Ambulatory Visit

## 2024-09-18 ENCOUNTER — Ambulatory Visit: Admission: RE | Admit: 2024-09-18 | Discharge: 2024-09-18 | Disposition: A | Source: Ambulatory Visit

## 2024-09-18 ENCOUNTER — Other Ambulatory Visit

## 2024-09-18 DIAGNOSIS — H4710 Unspecified papilledema: Secondary | ICD-10-CM

## 2024-09-18 MED ORDER — GADOPICLENOL 0.5 MMOL/ML IV SOLN
10.0000 mL | Freq: Once | INTRAVENOUS | Status: AC | PRN
  Administered 2024-09-18: 10 mL via INTRAVENOUS

## 2024-09-28 NOTE — Progress Notes (Unsigned)
 Leslie Mendoza, CMA,acting as a neurosurgeon for Leslie Ada, FNP.,have documented all relevant documentation on the behalf of Leslie Ada, FNP,as directed by  Leslie Ada, FNP while in the presence of Leslie Ada, FNP.  Subjective:  Patient ID: Leslie Mendoza , female    DOB: July 09, 1981 , 44 y.o.   MRN: [id number]  No chief complaint on file.   HPI  HPI   Past Medical History:  Diagnosis Date   Achilles tendinitis    Arthritis 2021   Feet   Heart palpitations 10/06/2018   Hyperlipidemia 2019   Hypertension 2019     Family History  Problem Relation Age of Onset   Cancer Mother    Hypertension Mother    Breast cancer Mother    Obesity Mother    Heart disease Father    Diabetes Father    Obesity Father    Cancer Maternal Grandmother    Diabetes Maternal Grandmother    Diabetes Maternal Aunt     Current Medications[1]   Allergies[2]   Review of Systems   There were no vitals filed for this visit. There is no height or weight on file to calculate BMI.  Wt Readings from Last 3 Encounters:  07/13/24 (!) 346 lb 12.8 oz (157.3 kg)  05/13/24 (!) 367 lb 3.2 oz (166.6 kg)  03/30/24 (!) 377 lb 9.6 oz (171.3 kg)    The 10-year ASCVD risk score (Arnett DK, et al., 2019) is: 11%   Values used to calculate the score:     Age: 41 years     Clinically relevant sex: Female     Is Non-Hispanic African American: Yes     Diabetic: Yes     Tobacco smoker: Yes     Systolic Blood Pressure: 130 mmHg     Is BP treated: Yes     HDL Cholesterol: 50 mg/dL     Total Cholesterol: 172 mg/dL  Objective:  Physical Exam      Assessment And Plan:   Assessment & Plan Type 2 diabetes mellitus in patient with obesity (HCC)  Essential hypertension   No orders of the defined types were placed in this encounter.    No follow-ups on file.  Patient was given opportunity to ask questions. Patient verbalized understanding of the plan and was able to repeat key elements of the plan. All  questions were answered to their satisfaction.    Leslie Leslie Ada, FNP, have reviewed all documentation for this visit. The documentation on 09/28/24 for the exam, diagnosis, procedures, and orders are all accurate and complete.   IF YOU HAVE BEEN REFERRED TO A SPECIALIST, IT MAY TAKE 1-2 WEEKS TO SCHEDULE/PROCESS THE REFERRAL. IF YOU HAVE NOT HEARD FROM US /SPECIALIST IN TWO WEEKS, PLEASE GIVE US  A CALL AT [phone].     [1]  Current Outpatient Medications:    albuterol  (VENTOLIN  HFA) 108 (90 Base) MCG/ACT inhaler, Inhale 1-2 puffs into the lungs every 6 (six) hours as needed for wheezing or shortness of breath., Disp: 18 g, Rfl: 0   amLODipine  (NORVASC ) 5 MG tablet, Take 1 tablet (5 mg total) by mouth daily., Disp: 90 tablet, Rfl: 1   atorvastatin  (LIPITOR) 10 MG tablet, Take 1 tablet (10 mg total) by mouth daily., Disp: 90 tablet, Rfl: 1   buPROPion  (WELLBUTRIN  XL) 150 MG 24 hr tablet, Take 1 tablet (150 mg total) by mouth every morning., Disp: 90 tablet, Rfl: 1   hydrochlorothiazide  (MICROZIDE ) 12.5 MG capsule, Take 1 capsule  by mouth daily., Disp: 90 capsule, Rfl: 1   meloxicam  (MOBIC ) 15 MG tablet, Take 15 mg by mouth daily as needed for pain., Disp: , Rfl:    tirzepatide  (MOUNJARO ) 7.5 MG/0.5ML Pen, Inject 7.5 mg into the skin once a week., Disp: 6 mL, Rfl: 1   Vitamin D , Ergocalciferol , 50000 units CAPS, Take 1 capsule by mouth 2 (two) times a week., Disp: 24 capsule, Rfl: 1 [2]  Allergies Allergen Reactions   Aspirin     rash   Pineapple     hives

## 2024-09-30 ENCOUNTER — Ambulatory Visit: Payer: Self-pay | Admitting: Nurse Practitioner

## 2024-09-30 ENCOUNTER — Encounter: Payer: Self-pay | Admitting: Nurse Practitioner

## 2024-09-30 VITALS — BP 126/80 | HR 78 | Temp 98.3°F | Ht 71.0 in | Wt 337.0 lb

## 2024-09-30 DIAGNOSIS — H4710 Unspecified papilledema: Secondary | ICD-10-CM

## 2024-09-30 DIAGNOSIS — I10 Essential (primary) hypertension: Secondary | ICD-10-CM

## 2024-09-30 DIAGNOSIS — E119 Type 2 diabetes mellitus without complications: Secondary | ICD-10-CM

## 2024-09-30 DIAGNOSIS — Z6841 Body Mass Index (BMI) 40.0 and over, adult: Secondary | ICD-10-CM

## 2024-10-01 ENCOUNTER — Ambulatory Visit: Payer: Self-pay | Admitting: Nurse Practitioner

## 2024-10-01 LAB — BMP8+EGFR
BUN/Creatinine Ratio: 12 (ref 9–23)
BUN: 12 mg/dL (ref 6–24)
CO2: 23 mmol/L (ref 20–29)
Calcium: 9.6 mg/dL (ref 8.7–10.2)
Chloride: 99 mmol/L (ref 96–106)
Creatinine, Ser: 1.02 mg/dL — ABNORMAL HIGH (ref 0.57–1.00)
Glucose: 71 mg/dL (ref 70–99)
Potassium: 3.7 mmol/L (ref 3.5–5.2)
Sodium: 138 mmol/L (ref 134–144)
eGFR: 70 mL/min/{1.73_m2}

## 2024-10-01 LAB — MICROALBUMIN / CREATININE URINE RATIO
Creatinine, Urine: 227.2 mg/dL
Microalb/Creat Ratio: 24 mg/g{creat} (ref 0–29)
Microalbumin, Urine: 55.4 ug/mL

## 2024-10-01 LAB — HEMOGLOBIN A1C
Est. average glucose Bld gHb Est-mCnc: 123 mg/dL
Hgb A1c MFr Bld: 5.9 % — ABNORMAL HIGH (ref 4.8–5.6)

## 2025-01-28 ENCOUNTER — Ambulatory Visit: Payer: Self-pay | Admitting: Nurse Practitioner
